# Patient Record
Sex: Male | Born: 1950 | Race: White | Hispanic: No | Marital: Married | Smoking: Current every day smoker
Health system: Southern US, Community
[De-identification: ages and names within clinical notes are randomized; demographics above are authoritative.]

## PROBLEM LIST (undated history)

## (undated) DIAGNOSIS — E119 Type 2 diabetes mellitus without complications: Secondary | ICD-10-CM

## (undated) DIAGNOSIS — J9610 Chronic respiratory failure, unspecified whether with hypoxia or hypercapnia: Secondary | ICD-10-CM

## (undated) DIAGNOSIS — J449 Chronic obstructive pulmonary disease, unspecified: Secondary | ICD-10-CM

## (undated) DIAGNOSIS — I5022 Chronic systolic (congestive) heart failure: Secondary | ICD-10-CM

## (undated) DIAGNOSIS — I10 Essential (primary) hypertension: Secondary | ICD-10-CM

## (undated) DIAGNOSIS — J189 Pneumonia, unspecified organism: Secondary | ICD-10-CM

## (undated) DIAGNOSIS — G4733 Obstructive sleep apnea (adult) (pediatric): Secondary | ICD-10-CM

## (undated) DIAGNOSIS — I2510 Atherosclerotic heart disease of native coronary artery without angina pectoris: Secondary | ICD-10-CM

## (undated) DIAGNOSIS — Z720 Tobacco use: Secondary | ICD-10-CM

## (undated) HISTORY — DX: Chronic respiratory failure, unspecified whether with hypoxia or hypercapnia: J96.10

## (undated) HISTORY — DX: Chronic obstructive pulmonary disease, unspecified: J44.9

## (undated) HISTORY — DX: Essential (primary) hypertension: I10

## (undated) HISTORY — PX: CATARACT EXTRACTION: SUR2

## (undated) HISTORY — DX: Tobacco use: Z72.0

## (undated) HISTORY — DX: Atherosclerotic heart disease of native coronary artery without angina pectoris: I25.10

## (undated) HISTORY — DX: Morbid (severe) obesity due to excess calories: E66.01

## (undated) HISTORY — PX: HERNIA REPAIR: SHX51

## (undated) HISTORY — DX: Type 2 diabetes mellitus without complications: E11.9

## (undated) HISTORY — DX: Obstructive sleep apnea (adult) (pediatric): G47.33

## (undated) HISTORY — DX: Chronic systolic (congestive) heart failure: I50.22

## (undated) HISTORY — DX: Pneumonia, unspecified organism: J18.9

---

## 2005-05-03 ENCOUNTER — Other Ambulatory Visit: Payer: Self-pay

## 2005-05-03 ENCOUNTER — Inpatient Hospital Stay: Payer: Self-pay | Admitting: Internal Medicine

## 2006-10-27 ENCOUNTER — Other Ambulatory Visit: Payer: Self-pay

## 2006-10-27 ENCOUNTER — Inpatient Hospital Stay: Payer: Self-pay | Admitting: Internal Medicine

## 2008-01-18 ENCOUNTER — Emergency Department: Payer: Self-pay | Admitting: Emergency Medicine

## 2008-10-25 ENCOUNTER — Inpatient Hospital Stay: Payer: Self-pay | Admitting: Internal Medicine

## 2010-11-14 ENCOUNTER — Ambulatory Visit: Payer: Self-pay | Admitting: Unknown Physician Specialty

## 2010-11-21 ENCOUNTER — Ambulatory Visit: Payer: Self-pay | Admitting: Unknown Physician Specialty

## 2011-08-29 ENCOUNTER — Other Ambulatory Visit: Payer: Self-pay | Admitting: Specialist

## 2011-09-02 LAB — BODY FLUID CULTURE

## 2012-05-11 ENCOUNTER — Inpatient Hospital Stay: Payer: Self-pay | Admitting: Internal Medicine

## 2012-05-11 LAB — CBC
HGB: 16.2 g/dL (ref 13.0–18.0)
MCH: 30.2 pg (ref 26.0–34.0)
MCHC: 31.8 g/dL — ABNORMAL LOW (ref 32.0–36.0)
MCV: 95 fL (ref 80–100)
Platelet: 246 10*3/uL (ref 150–440)
RBC: 5.38 10*6/uL (ref 4.40–5.90)
RDW: 16.1 % — ABNORMAL HIGH (ref 11.5–14.5)

## 2012-05-11 LAB — COMPREHENSIVE METABOLIC PANEL
Albumin: 3.1 g/dL — ABNORMAL LOW (ref 3.4–5.0)
Bilirubin,Total: 1.6 mg/dL — ABNORMAL HIGH (ref 0.2–1.0)
Calcium, Total: 9.7 mg/dL (ref 8.5–10.1)
Chloride: 92 mmol/L — ABNORMAL LOW (ref 98–107)
Co2: 34 mmol/L — ABNORMAL HIGH (ref 21–32)
Creatinine: 0.78 mg/dL (ref 0.60–1.30)
EGFR (Non-African Amer.): >60
Glucose: 123 mg/dL — ABNORMAL HIGH (ref 65–99)
Potassium: 4.4 mmol/L (ref 3.5–5.1)
Sodium: 131 mmol/L — ABNORMAL LOW (ref 136–145)
Total Protein: 7.7 g/dL (ref 6.4–8.2)

## 2012-05-11 LAB — PRO B NATRIURETIC PEPTIDE: B-Type Natriuretic Peptide: 1682 pg/mL — ABNORMAL HIGH (ref 0–125)

## 2012-05-12 LAB — BASIC METABOLIC PANEL
BUN: 14 mg/dL (ref 7–18)
Calcium, Total: 9.3 mg/dL (ref 8.5–10.1)
Chloride: 91 mmol/L — ABNORMAL LOW (ref 98–107)
Co2: 34 mmol/L — ABNORMAL HIGH (ref 21–32)
EGFR (African American): >60
EGFR (Non-African Amer.): >60
Osmolality: 266 (ref 275–301)
Sodium: 129 mmol/L — ABNORMAL LOW (ref 136–145)

## 2012-05-12 LAB — CK TOTAL AND CKMB (NOT AT ARMC)
CK, Total: 135 U/L (ref 35–232)
CK, Total: 136 U/L (ref 35–232)
CK-MB: 3 ng/mL (ref 0.5–3.6)

## 2012-05-12 LAB — LIPID PANEL
Cholesterol: 92 mg/dL (ref 0–200)
Ldl Cholesterol, Calc: 65 mg/dL (ref 0–100)
Triglycerides: 57 mg/dL (ref 0–200)
VLDL Cholesterol, Calc: 11 mg/dL (ref 5–40)

## 2012-05-12 LAB — CBC WITH DIFFERENTIAL/PLATELET
Eosinophil #: 0 10*3/uL (ref 0.0–0.7)
HGB: 15.6 g/dL (ref 13.0–18.0)
Lymphocyte #: 0.4 10*3/uL — ABNORMAL LOW (ref 1.0–3.6)
Lymphocyte %: 4.2 %
MCH: 29.8 pg (ref 26.0–34.0)
MCHC: 31.5 g/dL — ABNORMAL LOW (ref 32.0–36.0)
Monocyte %: 2 %
Neutrophil %: 93.3 %
Platelet: 206 10*3/uL (ref 150–440)
RDW: 16.2 % — ABNORMAL HIGH (ref 11.5–14.5)
WBC: 9.6 10*3/uL (ref 3.8–10.6)

## 2012-05-12 LAB — HEMOGLOBIN A1C: Hemoglobin A1C: 7.5 % — ABNORMAL HIGH (ref 4.2–6.3)

## 2012-05-12 LAB — TROPONIN I: Troponin-I: <0.02 ng/mL

## 2012-05-12 LAB — MAGNESIUM: Magnesium: 1.3 mg/dL — ABNORMAL LOW

## 2012-05-13 LAB — BASIC METABOLIC PANEL
Anion Gap: 2 — ABNORMAL LOW (ref 7–16)
BUN: 19 mg/dL — ABNORMAL HIGH (ref 7–18)
Chloride: 91 mmol/L — ABNORMAL LOW (ref 98–107)
Co2: 40 mmol/L (ref 21–32)
Creatinine: 0.7 mg/dL (ref 0.60–1.30)
EGFR (African American): >60
Glucose: 109 mg/dL — ABNORMAL HIGH (ref 65–99)
Osmolality: 269 (ref 275–301)
Potassium: 4 mmol/L (ref 3.5–5.1)
Sodium: 133 mmol/L — ABNORMAL LOW (ref 136–145)

## 2012-05-13 LAB — CBC WITH DIFFERENTIAL/PLATELET
Eosinophil #: 0 10*3/uL (ref 0.0–0.7)
Eosinophil %: 0.3 %
HGB: 14.9 g/dL (ref 13.0–18.0)
Lymphocyte #: 1.6 10*3/uL (ref 1.0–3.6)
Lymphocyte %: 19.7 %
MCH: 29.8 pg (ref 26.0–34.0)
MCHC: 31.3 g/dL — ABNORMAL LOW (ref 32.0–36.0)
MCV: 95 fL (ref 80–100)
Monocyte %: 8.5 %
Neutrophil %: 70.7 %
Platelet: 209 10*3/uL (ref 150–440)
RBC: 5 10*6/uL (ref 4.40–5.90)
WBC: 8.2 10*3/uL (ref 3.8–10.6)

## 2012-05-17 LAB — CULTURE, BLOOD (SINGLE)

## 2012-05-20 ENCOUNTER — Encounter: Payer: Self-pay | Admitting: Cardiovascular Disease

## 2012-05-20 ENCOUNTER — Ambulatory Visit (INDEPENDENT_AMBULATORY_CARE_PROVIDER_SITE_OTHER): Payer: Medicare Other | Admitting: Cardiovascular Disease

## 2012-05-20 VITALS — BP 156/102 | HR 87 | Ht 72.0 in | Wt 314.8 lb

## 2012-05-20 DIAGNOSIS — I10 Essential (primary) hypertension: Secondary | ICD-10-CM | POA: Insufficient documentation

## 2012-05-20 DIAGNOSIS — I5022 Chronic systolic (congestive) heart failure: Secondary | ICD-10-CM | POA: Insufficient documentation

## 2012-05-20 DIAGNOSIS — I509 Heart failure, unspecified: Secondary | ICD-10-CM

## 2012-05-20 MED ORDER — ASPIRIN 81 MG PO TABS: 81.0000 mg | ORAL_TABLET | Freq: Every day | ORAL | Status: AC

## 2012-05-20 MED ORDER — METOPROLOL SUCCINATE ER 25 MG PO TB24: 25.0000 mg | ORAL_TABLET | Freq: Every day | ORAL | Status: DC

## 2012-05-20 MED ORDER — LISINOPRIL 10 MG PO TABS: 10.0000 mg | ORAL_TABLET | Freq: Every day | ORAL | Status: DC

## 2012-05-20 NOTE — Progress Notes (Signed)
Primary care physician: Dr. Maryellen Pile  HPI  This is a 62 year old male who is here today to establish cardiovascular care due to newly diagnosed cardiomyopathy. He has extensive medical problems that include COPD with extensive smoking history, hypertension, type 2 diabetes, morbid obesity and obstructive sleep apnea. He presented on April 12 to [facility] with worsening dyspnea and hypoxia. He was diagnosed with pneumonia. He was significantly tachycardic on presentation and was also noted to have fluid overload. He had an echocardiogram done which showed an ejection fraction of 25%. The patient is not aware of any previous cardiac history before this. He reports no known history of coronary artery disease. He denies any chest pain. He continues to have significant dyspnea but feels significantly better. He is taking Lasix 40 mg once daily. He reports chronic lower extremity edema. He is on home oxygen and quit smoking only 2 weeks ago.  Allergies  Allergen Reactions  . Androgel (Testosterone)     swelling     Current Outpatient Prescriptions on File Prior to Visit  Medication Sig Dispense Refill  . albuterol (PROVENTIL HFA;VENTOLIN HFA) 108 (90 BASE) MCG/ACT inhaler Inhale 2 puffs into the lungs every 4 (four) hours as needed for wheezing.      . furosemide (LASIX) 40 MG tablet Take 40 mg by mouth daily.       Marland Kitchen glimepiride (AMARYL) 4 MG tablet Take 4 mg by mouth daily before breakfast.      . pramipexole (MIRAPEX) 0.5 MG tablet Take 0.5 mg by mouth daily.      Marland Kitchen tiotropium (SPIRIVA) 18 MCG inhalation capsule Place 18 mcg into inhaler and inhale daily.       No current facility-administered medications on file prior to visit.     Past Medical History  Diagnosis Date  . Diabetes mellitus without complication   . CHF (congestive heart failure)   . COPD (chronic obstructive pulmonary disease)   . OSA (obstructive sleep apnea)   . Morbid obesity   . Chronic respiratory failure   .  Cardiomyopathy     EF 25 %  . Hypertension   . Chronic systolic heart failure     EF of 20%     Past Surgical History  Procedure Laterality Date  . Cataract extraction      right eye  . Hernia repair       Family History  Problem Relation Age of Onset  . Hypertension Father   . Heart disease Father   . Hyperlipidemia Father      History   Social History  . Marital Status: Married    Spouse Name: N/A    Number of Children: N/A  . Years of Education: N/A   Occupational History  . Not on file.   Social History Main Topics  . Smoking status: Former Smoker -- 1.50 packs/day for 40 years    Types: Cigarettes  . Smokeless tobacco: Former Neurosurgeon    Quit date: 05/12/2012  . Alcohol Use: No  . Drug Use: No  . Sexually Active: Not on file   Other Topics Concern  . Not on file   Social History Narrative  . No narrative on file     ROS Constitutional: Negative for fever, chills, diaphoresis, activity change, appetite change and fatigue.  HENT: Negative for hearing loss, nosebleeds, congestion, sore throat, facial swelling, drooling, trouble swallowing, neck pain, voice change, sinus pressure and tinnitus.  Eyes: Negative for photophobia, pain, discharge and visual disturbance.  Respiratory:  Negative forchest tightness, and wheezing.  Cardiovascular: Negative for chest pain, palpitations . Gastrointestinal: Negative for nausea, vomiting, abdominal pain, diarrhea, constipation, blood in stool and abdominal distention.  Genitourinary: Negative for dysuria, urgency, frequency, hematuria and decreased urine volume.  Musculoskeletal: Negative for myalgias, back pain, joint swelling, arthralgias and gait problem.  Skin: Negative for color change, pallor, rash and wound.  Neurological: Negative for dizziness, tremors, seizures, syncope, speech difficulty, weakness, light-headedness, numbness and headaches.  Psychiatric/Behavioral: Negative for suicidal ideas, hallucinations,  behavioral problems and agitation. The patient is not nervous/anxious.     PHYSICAL EXAM   BP 156/102  Pulse 87  Ht 6' (1.829 m)  Wt 314 lb 12 oz (142.77 kg)  BMI 42.68 kg/m2 Constitutional: He is oriented to person, place, and time. He appears well-developed and well-nourished. No distress.  HENT: No nasal discharge.  Head: Normocephalic and atraumatic.  Eyes: Pupils are equal and round. Right eye exhibits no discharge. Left eye exhibits no discharge.  Neck: Normal range of motion. Neck supple. No JVD present. No thyromegaly present.  Cardiovascular: Normal rate, regular rhythm, normal heart sounds and. Exam reveals no gallop and no friction rub. No murmur heard.  Pulmonary/Chest: Effort normal and breath sounds normal. No stridor. No respiratory distress. He has no wheezes. He has no rales. He exhibits no tenderness.  Abdominal: Soft. Bowel sounds are normal. He exhibits no distension. There is no tenderness. There is no rebound and no guarding.  Musculoskeletal: Normal range of motion. He exhibits trace edema and no tenderness.  Neurological: He is alert and oriented to person, place, and time. Coordination normal.  Skin: Skin is warm and dry. Chronic stasis dermatitis in the lower extremities. He is not diaphoretic. No erythema. No pallor.  Psychiatric: He has a normal mood and affect. His behavior is normal. Judgment and thought content normal.       EKG: Normal sinus with incomplete right bundle branch block and possible right ventricular hypertrophy.   ASSESSMENT AND PLAN

## 2012-05-20 NOTE — Patient Instructions (Addendum)
Start Aspirin 81 mg once daily.  Increase Metoprolol to 25 mg once daily.  Start Lisinopril 10 mg once daily.  Labs to be done in 1 week.  Follow up in 2 weeks.

## 2012-05-20 NOTE — Assessment & Plan Note (Signed)
His blood pressure is elevated. Changes in his medications are outlined above.

## 2012-05-20 NOTE — Assessment & Plan Note (Signed)
Robert Mccarthy was diagnosed recently with cardiomyopathy with an ejection fraction of 25%. The differential diagnosis is still wide at this time include ischemic cardiomyopathy, diabetic cardiomyopathy or nonischemic cardiomyopathy. I think we need to optimize his medications. He appears to be euvolemic and I recommend continuing the current dose of Lasix. I will go ahead and increase the dose of Toprol to 25 mg once daily and add lisinopril 10 mg once daily. Check basic metabolic profile in one week. Followup in 2 weeks for up titration of medications. He has a high chance of underlying coronary artery disease given his multiple risk factors. I will plan on proceeding with ischemic cardiac evaluation once his heart failure is more optimize. I also asked him to start taking aspirin 81 mg once daily. We discussed the importance of low sodium diet.

## 2012-05-22 ENCOUNTER — Encounter: Payer: Self-pay | Admitting: Cardiovascular Disease

## 2012-05-29 ENCOUNTER — Ambulatory Visit (INDEPENDENT_AMBULATORY_CARE_PROVIDER_SITE_OTHER): Payer: Medicare Other

## 2012-05-29 DIAGNOSIS — I509 Heart failure, unspecified: Secondary | ICD-10-CM

## 2012-05-30 LAB — BASIC METABOLIC PANEL
BUN/Creatinine Ratio: 27 — ABNORMAL HIGH (ref 10–22)
BUN: 18 mg/dL (ref 8–27)
CO2: 28 mmol/L (ref 19–28)
Calcium: 10.3 mg/dL — ABNORMAL HIGH (ref 8.6–10.2)
Chloride: 92 mmol/L — ABNORMAL LOW (ref 97–108)
Creatinine, Ser: 0.67 mg/dL — ABNORMAL LOW (ref 0.76–1.27)
Glucose: 59 mg/dL — ABNORMAL LOW (ref 65–99)

## 2012-05-31 ENCOUNTER — Other Ambulatory Visit: Payer: Self-pay

## 2012-06-13 ENCOUNTER — Ambulatory Visit (INDEPENDENT_AMBULATORY_CARE_PROVIDER_SITE_OTHER): Payer: Medicare Other | Admitting: Cardiovascular Disease

## 2012-06-13 ENCOUNTER — Encounter: Payer: Self-pay | Admitting: Cardiovascular Disease

## 2012-06-13 VITALS — BP 158/72 | HR 73 | Ht 72.0 in | Wt 318.2 lb

## 2012-06-13 DIAGNOSIS — I10 Essential (primary) hypertension: Secondary | ICD-10-CM

## 2012-06-13 DIAGNOSIS — I5022 Chronic systolic (congestive) heart failure: Secondary | ICD-10-CM

## 2012-06-13 DIAGNOSIS — I509 Heart failure, unspecified: Secondary | ICD-10-CM

## 2012-06-13 MED ORDER — LISINOPRIL 20 MG PO TABS: 20.0000 mg | ORAL_TABLET | Freq: Every day | ORAL | Status: DC

## 2012-06-13 MED ORDER — METOPROLOL SUCCINATE ER 50 MG PO TB24: 50.0000 mg | ORAL_TABLET | Freq: Every day | ORAL | Status: DC

## 2012-06-13 NOTE — Patient Instructions (Addendum)
Increase Lisinopril to 20 mg once daily.  Increase Metoprolol ER to 50 mg once daily.   Schedule a left cardiac cath.

## 2012-06-13 NOTE — Progress Notes (Signed)
Primary care physician: Dr. Maryellen Pile  HPI  This is a 62 year old male who is here today for a followup visit regarding chronic systolic heart failure and recently diagnosed  cardiomyopathy. He has extensive medical problems that include COPD with extensive smoking history, hypertension, type 2 diabetes, morbid obesity and obstructive sleep apnea. He presented on April 12 to [facility] with worsening dyspnea and hypoxia. He was diagnosed with pneumonia. He was significantly tachycardic on presentation and was also noted to have fluid overload. He had an echocardiogram done which showed an ejection fraction of 25%. He quit smoking at that time. During last visit, I added small dose Toprol and lisinopril for cardiomyopathy. He continues to take Lasix 40 mg once daily. Overall, he feels slightly better but continues to have significant dyspnea without chest pain. He denies palpitations and is noted today to be in ventricular bigeminy.  Allergies  Allergen Reactions  . Androgel (Testosterone)     swelling     Current Outpatient Prescriptions on File Prior to Visit  Medication Sig Dispense Refill  . albuterol (PROVENTIL HFA;VENTOLIN HFA) 108 (90 BASE) MCG/ACT inhaler Inhale 2 puffs into the lungs every 4 (four) hours as needed for wheezing.      Marland Kitchen aspirin 81 MG tablet Take 1 tablet (81 mg total) by mouth daily.  30 tablet  0  . furosemide (LASIX) 40 MG tablet Take 40 mg by mouth daily.       Marland Kitchen glimepiride (AMARYL) 4 MG tablet Take 4 mg by mouth daily before breakfast.      . ipratropium-albuterol (DUONEB) 0.5-2.5 (3) MG/3ML SOLN Take 3 mLs by nebulization every 4 (four) hours as needed.      Marland Kitchen lisinopril (PRINIVIL,ZESTRIL) 10 MG tablet Take 1 tablet (10 mg total) by mouth daily.  30 tablet  3  . metoprolol succinate (TOPROL XL) 25 MG 24 hr tablet Take 1 tablet (25 mg total) by mouth daily.  30 tablet  3  . NON FORMULARY Oxygen 2 liters daily      . pramipexole (MIRAPEX) 0.5 MG tablet Take 0.5 mg by mouth  daily.      Marland Kitchen tiotropium (SPIRIVA) 18 MCG inhalation capsule Place 18 mcg into inhaler and inhale daily.       No current facility-administered medications on file prior to visit.     Past Medical History  Diagnosis Date  . Diabetes mellitus without complication   . CHF (congestive heart failure)   . COPD (chronic obstructive pulmonary disease)   . OSA (obstructive sleep apnea)   . Morbid obesity   . Chronic respiratory failure   . Cardiomyopathy     EF 25 %  . Hypertension   . Chronic systolic heart failure     EF of 20%     Past Surgical History  Procedure Laterality Date  . Cataract extraction      right eye  . Hernia repair       Family History  Problem Relation Age of Onset  . Hypertension Father   . Heart disease Father   . Hyperlipidemia Father      History   Social History  . Marital Status: Married    Spouse Name: N/A    Number of Children: N/A  . Years of Education: N/A   Occupational History  . Not on file.   Social History Main Topics  . Smoking status: Former Smoker -- 1.50 packs/day for 40 years    Types: Cigarettes  . Smokeless tobacco: Former  User    Quit date: 05/12/2012  . Alcohol Use: No  . Drug Use: No  . Sexually Active: Not on file   Other Topics Concern  . Not on file   Social History Narrative  . No narrative on file        PHYSICAL EXAM   BP 158/72  Pulse 73  Ht 6' (1.829 m)  Wt 318 lb 4 oz (144.357 kg)  BMI 43.15 kg/m2  SpO2 85% Constitutional: He is oriented to person, place, and time. He appears well-developed and well-nourished. No distress.  HENT: No nasal discharge.  Head: Normocephalic and atraumatic.  Eyes: Pupils are equal and round. Right eye exhibits no discharge. Left eye exhibits no discharge.  Neck: Normal range of motion. Neck supple. No JVD present. No thyromegaly present.  Cardiovascular: Normal rate, regular rhythm, normal heart sounds and. Exam reveals no gallop and no friction rub. No  murmur heard.  Pulmonary/Chest: Effort normal and breath sounds normal. No stridor. No respiratory distress. He has no wheezes. He has no rales. He exhibits no tenderness.  Abdominal: Soft. Bowel sounds are normal. He exhibits no distension. There is no tenderness. There is no rebound and no guarding.  Musculoskeletal: Normal range of motion. He exhibits trace edema and no tenderness.  Neurological: He is alert and oriented to person, place, and time. Coordination normal.  Skin: Skin is warm and dry. Chronic stasis dermatitis in the lower extremities. He is not diaphoretic. No erythema. No pallor.  Psychiatric: He has a normal mood and affect. His behavior is normal. Judgment and thought content normal.       EKG: Normal sinus rhythm with first degree AV block, frequent PVCs in the form of ventricular bigeminy, incomplete right bundle branch block.   ASSESSMENT AND PLAN

## 2012-06-13 NOTE — Assessment & Plan Note (Signed)
He continues to be in [place] Heart Association class III. Ejection fraction was severely reduced at 25%. I think we have to rule out underlying ischemic etiology especially with his multiple risk factors including diabetes. Due to that, I recommend proceeding with a left heart catheterization. A stress test would have a low diagnostic yield in his situation due to cardiomyopathy and morbid obesity. Risks, benefits were discussed with him. For now, I will go ahead and increase the dose of Toprol to 50 mg once daily especially that he is having frequent PVCs. I will also increase lisinopril to 20 mg once daily.

## 2012-06-13 NOTE — Assessment & Plan Note (Signed)
Blood pressure improved but still not controlled. His antihypertensive medications were increased as outlined above.

## 2012-06-14 ENCOUNTER — Encounter: Payer: Self-pay | Admitting: Cardiovascular Disease

## 2012-06-14 LAB — CBC WITH DIFFERENTIAL/PLATELET
Basos: 1 % (ref 0–3)
Eosinophils Absolute: 0.2 10*3/uL (ref 0.0–0.4)
Hemoglobin: 16.4 g/dL (ref 12.6–17.7)
Immature Grans (Abs): 0 10*3/uL (ref 0.0–0.1)
Lymphs: 29 % (ref 14–46)
MCH: 30 pg (ref 26.6–33.0)
Monocytes: 7 % (ref 4–12)
Neutrophils Absolute: 4.5 10*3/uL (ref 1.4–7.0)
Neutrophils Relative %: 61 % (ref 40–74)
RBC: 5.46 x10E6/uL (ref 4.14–5.80)
WBC: 7.3 10*3/uL (ref 3.4–10.8)

## 2012-06-14 LAB — PROTIME-INR
INR: 1.1 (ref 0.8–1.2)
Prothrombin Time: 11.8 s (ref 9.1–12.0)

## 2012-06-14 LAB — BASIC METABOLIC PANEL
BUN/Creatinine Ratio: 19 (ref 10–22)
BUN: 11 mg/dL (ref 8–27)
CO2: 28 mmol/L (ref 19–28)
Calcium: 10.1 mg/dL (ref 8.6–10.2)
Chloride: 96 mmol/L — ABNORMAL LOW (ref 97–108)
Creatinine, Ser: 0.57 mg/dL — ABNORMAL LOW (ref 0.76–1.27)
Glucose: 112 mg/dL — ABNORMAL HIGH (ref 65–99)

## 2012-06-20 ENCOUNTER — Ambulatory Visit: Payer: Self-pay | Admitting: Cardiovascular Disease

## 2012-06-20 DIAGNOSIS — I2510 Atherosclerotic heart disease of native coronary artery without angina pectoris: Secondary | ICD-10-CM

## 2012-06-20 HISTORY — PX: CARDIAC CATHETERIZATION: SHX172

## 2012-06-27 ENCOUNTER — Encounter: Payer: Self-pay | Admitting: *Deleted

## 2012-07-09 ENCOUNTER — Encounter: Payer: Self-pay | Admitting: Cardiovascular Disease

## 2012-07-09 ENCOUNTER — Ambulatory Visit (INDEPENDENT_AMBULATORY_CARE_PROVIDER_SITE_OTHER): Payer: Medicare Other | Admitting: Cardiovascular Disease

## 2012-07-09 VITALS — BP 132/92 | HR 73 | Ht 72.0 in | Wt 327.5 lb

## 2012-07-09 DIAGNOSIS — I10 Essential (primary) hypertension: Secondary | ICD-10-CM

## 2012-07-09 DIAGNOSIS — I5022 Chronic systolic (congestive) heart failure: Secondary | ICD-10-CM

## 2012-07-09 DIAGNOSIS — I2510 Atherosclerotic heart disease of native coronary artery without angina pectoris: Secondary | ICD-10-CM

## 2012-07-09 MED ORDER — LISINOPRIL 40 MG PO TABS: 40.0000 mg | ORAL_TABLET | Freq: Every day | ORAL | Status: DC

## 2012-07-09 NOTE — Assessment & Plan Note (Signed)
His blood pressure is still not at target. Lisinopril was increased as outlined above.

## 2012-07-09 NOTE — Patient Instructions (Addendum)
Increase Lisinopril to 40 mg once daily (you can take 2 tablets of 20 mg).  Continue other medications.  Follow up in 1 months.

## 2012-07-09 NOTE — Assessment & Plan Note (Signed)
Recent cardiac catheterization showed moderate 2 vessel coronary artery disease. Continue aspirin daily. Due to this and the fact that he is diabetic, he was likely benefit from being on a statin. I will discuss this with him during next visit and likely atorvastatin.

## 2012-07-09 NOTE — Assessment & Plan Note (Signed)
He continues to be in [place] Heart Association class III. Ejection fraction was initially  25% but improved recently to 35%. Cardiac catheterization showed no evidence of obstructive CAD. 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  I recommend optimizing his medications. I will go ahead and increase lisinopril to 40 mg once daily. His renal function is known to be normal. The next step would be to increase the dose of Toprol and possibly adding aldosterone blocking agent. He is likely mildly fluid overloaded based on elevated LVEDP during catheterization. However, his blood pressure was also high at that time. Continue current dose of Lasix 40 mg once daily.

## 2012-07-09 NOTE — Progress Notes (Signed)
Primary care physician: Dr. Maryellen Pile  HPI  This is a 62 year old male who is here today for a followup visit regarding chronic systolic heart failure. He has extensive medical problems that include severe COPD with extensive smoking history currently on home oxygen, hypertension, type 2 diabetes, morbid obesity and obstructive sleep apnea. He presented on April 12 to [facility] with worsening dyspnea and hypoxia. He was diagnosed with pneumonia. He was significantly tachycardic on presentation and was also noted to have fluid overload. He had an echocardiogram done which showed an ejection fraction of 25%. He quit smoking at that time. I started him on small dose Toprol and lisinopril for cardiomyopathy. He underwent cardiac catheterization via the right radial artery which showed moderate two-vessel disease in the mid RCA and mid left circumflex. Ejection fraction was 35%. His cardiomyopathy was out of proportion to coronary artery disease. His blood pressure was elevated during the catheterization with an LVEDP of 22 mm mercury. Overall, he continues to improve. He denies chest discomfort.  Allergies  Allergen Reactions  . Androgel (Testosterone)     swelling     Current Outpatient Prescriptions on File Prior to Visit  Medication Sig Dispense Refill  . albuterol (PROVENTIL HFA;VENTOLIN HFA) 108 (90 BASE) MCG/ACT inhaler Inhale 2 puffs into the lungs every 4 (four) hours as needed for wheezing.      Marland Kitchen aspirin 81 MG tablet Take 1 tablet (81 mg total) by mouth daily.  30 tablet  0  . furosemide (LASIX) 40 MG tablet Take 40 mg by mouth daily.       Marland Kitchen glimepiride (AMARYL) 4 MG tablet Take 4 mg by mouth daily before breakfast.      . ipratropium-albuterol (DUONEB) 0.5-2.5 (3) MG/3ML SOLN Take 3 mLs by nebulization every 4 (four) hours as needed.      Marland Kitchen lisinopril (PRINIVIL,ZESTRIL) 20 MG tablet Take 1 tablet (20 mg total) by mouth daily.  30 tablet  3  . metoprolol succinate (TOPROL-XL) 50 MG 24 hr  tablet Take 1 tablet (50 mg total) by mouth daily. Take with or immediately following a meal.  30 tablet  3  . NON FORMULARY Oxygen 2 liters daily      . tiotropium (SPIRIVA) 18 MCG inhalation capsule Place 18 mcg into inhaler and inhale daily.       No current facility-administered medications on file prior to visit.     Past Medical History  Diagnosis Date  . Diabetes mellitus without complication   . CHF (congestive heart failure)   . COPD (chronic obstructive pulmonary disease)   . OSA (obstructive sleep apnea)   . Morbid obesity   . Chronic respiratory failure   . Cardiomyopathy     EF 25 %  . Hypertension   . Chronic systolic heart failure     EF of 20%     Past Surgical History  Procedure Laterality Date  . Cataract extraction      right eye  . Hernia repair    . Cardiac catheterization  06/20/12    [facility];EF 35%     Family History  Problem Relation Age of Onset  . Hypertension Father   . Heart disease Father   . Hyperlipidemia Father      History   Social History  . Marital Status: Married    Spouse Name: N/A    Number of Children: N/A  . Years of Education: N/A   Occupational History  . Not on file.   Social History  Main Topics  . Smoking status: Former Smoker -- 1.50 packs/day for 40 years    Types: Cigarettes  . Smokeless tobacco: Former Neurosurgeon    Quit date: 05/12/2012  . Alcohol Use: No  . Drug Use: No  . Sexually Active: Not on file   Other Topics Concern  . Not on file   Social History Narrative  . No narrative on file        PHYSICAL EXAM   BP 132/92  Pulse 73  Ht 6' (1.829 m)  Wt 327 lb 8 oz (148.553 kg)  BMI 44.41 kg/m2 Constitutional: He is oriented to person, place, and time. He appears well-developed and well-nourished. No distress.  HENT: No nasal discharge.  Head: Normocephalic and atraumatic.  Eyes: Pupils are equal and round. Right eye exhibits no discharge. Left eye exhibits no discharge.  Neck: Normal range of  motion. Neck supple. No JVD present. No thyromegaly present.  Cardiovascular: Normal rate, regular rhythm, normal heart sounds and. Exam reveals no gallop and no friction rub. No murmur heard.  Pulmonary/Chest: Effort normal and breath sounds normal. No stridor. No respiratory distress. He has no wheezes. He has no rales. He exhibits no tenderness.  Abdominal: Soft. Bowel sounds are normal. He exhibits no distension. There is no tenderness. There is no rebound and no guarding.  Musculoskeletal: Normal range of motion. He exhibits trace edema and no tenderness.  Neurological: He is alert and oriented to person, place, and time. Coordination normal.  Skin: Skin is warm and dry. Chronic stasis dermatitis in the lower extremities. He is not diaphoretic. No erythema. No pallor.  Psychiatric: He has a normal mood and affect. His behavior is normal. Judgment and thought content normal.  Right radial pulse is normal with no hematoma    EKG: Sinus  Rhythm  - occasional ectopic ventricular beat    Low voltage in limb leads.   -Nonspecific QRS widening and right axis -consider right ventricular hypertrophy.   -  Negative precordial T-waves.   ABNORMAL    ASSESSMENT AND PLAN

## 2012-08-09 ENCOUNTER — Ambulatory Visit (INDEPENDENT_AMBULATORY_CARE_PROVIDER_SITE_OTHER): Payer: Medicare Other | Admitting: Cardiovascular Disease

## 2012-08-09 ENCOUNTER — Encounter: Payer: Self-pay | Admitting: Cardiovascular Disease

## 2012-08-09 VITALS — BP 134/74 | HR 68 | Ht 72.0 in | Wt 307.8 lb

## 2012-08-09 DIAGNOSIS — I509 Heart failure, unspecified: Secondary | ICD-10-CM

## 2012-08-09 DIAGNOSIS — I5022 Chronic systolic (congestive) heart failure: Secondary | ICD-10-CM

## 2012-08-09 DIAGNOSIS — I2510 Atherosclerotic heart disease of native coronary artery without angina pectoris: Secondary | ICD-10-CM

## 2012-08-09 DIAGNOSIS — I10 Essential (primary) hypertension: Secondary | ICD-10-CM

## 2012-08-09 MED ORDER — EPLERENONE 25 MG PO TABS: 25.0000 mg | ORAL_TABLET | Freq: Every day | ORAL | Status: DC

## 2012-08-09 NOTE — Assessment & Plan Note (Signed)
Ejection fraction was initially  25% but improved recently to 35%. Cardiac catheterization showed no evidence of obstructive CAD. 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 It's difficult to determine his functional status due to advanced COPD. Continue treatment with Toprol and lisinopril. I will add Eplerenone 25 mg once daily. Stop potassium supplements. Check basic metabolic profile in one week.

## 2012-08-09 NOTE — Progress Notes (Signed)
Primary care physician: Dr. Maryellen Pile  HPI  This is a 62 year old male who is here today for a followup visit regarding chronic systolic heart failure. He has extensive medical problems that include severe COPD with extensive smoking history currently on home oxygen, hypertension, type 2 diabetes, morbid obesity and obstructive sleep apnea. He presented on April 12 to [facility] with worsening dyspnea and hypoxia. He was diagnosed with pneumonia. He was significantly tachycardic on presentation and was also noted to have fluid overload. He had an echocardiogram done which showed an ejection fraction of 25%. He quit smoking at that time. I started him on small dose Toprol and lisinopril for cardiomyopathy. He underwent cardiac catheterization via the right radial artery which showed moderate two-vessel disease in the mid RCA and mid left circumflex. Ejection fraction was 35%. His cardiomyopathy was out of proportion to coronary artery disease. His blood pressure was elevated during the catheterization with an LVEDP of 22 mm mercury. Overall, he continues to improve. He denies chest discomfort. He uses oxygen continuously. His blood pressure is more controlled after increasing lisinopril to 40 mg once daily.   Allergies  Allergen Reactions  . Androgel (Testosterone)     swelling     Current Outpatient Prescriptions on File Prior to Visit  Medication Sig Dispense Refill  . albuterol (PROVENTIL HFA;VENTOLIN HFA) 108 (90 BASE) MCG/ACT inhaler Inhale 2 puffs into the lungs every 4 (four) hours as needed for wheezing.      Marland Kitchen aspirin 81 MG tablet Take 1 tablet (81 mg total) by mouth daily.  30 tablet  0  . furosemide (LASIX) 40 MG tablet Take 40 mg by mouth daily.       Marland Kitchen glimepiride (AMARYL) 4 MG tablet Take 4 mg by mouth daily before breakfast.      . ipratropium-albuterol (DUONEB) 0.5-2.5 (3) MG/3ML SOLN Take 3 mLs by nebulization every 4 (four) hours as needed.      Marland Kitchen lisinopril (PRINIVIL,ZESTRIL) 40 MG  tablet Take 1 tablet (40 mg total) by mouth daily.  30 tablet  6  . metoprolol succinate (TOPROL-XL) 50 MG 24 hr tablet Take 1 tablet (50 mg total) by mouth daily. Take with or immediately following a meal.  30 tablet  3  . NON FORMULARY Oxygen 2 liters daily      . pramipexole (MIRAPEX) 1 MG tablet Take 1 mg by mouth daily.      Marland Kitchen tiotropium (SPIRIVA) 18 MCG inhalation capsule Place 18 mcg into inhaler and inhale daily.       No current facility-administered medications on file prior to visit.     Past Medical History  Diagnosis Date  . Diabetes mellitus without complication   . CHF (congestive heart failure)   . COPD (chronic obstructive pulmonary disease)   . OSA (obstructive sleep apnea)   . Morbid obesity   . Chronic respiratory failure   . Cardiomyopathy     EF 25 %  . Hypertension   . Chronic systolic heart failure     EF of 20%  . Coronary artery disease     Cardiac cath in 05/2012: EF 35%, no evidence of obstructive disease with 60% mid left circumflex stenosis and 60% mid RCA stenosis     Past Surgical History  Procedure Laterality Date  . Cataract extraction      right eye  . Hernia repair    . Cardiac catheterization  06/20/12    [facility];EF 35%     Family History  Problem Relation Age of Onset  . Hypertension Father   . Heart disease Father   . Hyperlipidemia Father      History   Social History  . Marital Status: Married    Spouse Name: N/A    Number of Children: N/A  . Years of Education: N/A   Occupational History  . Not on file.   Social History Main Topics  . Smoking status: Former Smoker -- 1.50 packs/day for 40 years    Types: Cigarettes  . Smokeless tobacco: Former Neurosurgeon    Quit date: 05/12/2012  . Alcohol Use: No  . Drug Use: No  . Sexually Active: Not on file   Other Topics Concern  . Not on file   Social History Narrative  . No narrative on file        PHYSICAL EXAM   BP 134/74  Pulse 68  Ht 6' (1.829 m)  Wt 307 lb  12.8 oz (139.617 kg)  BMI 41.74 kg/m2 Constitutional: He is oriented to person, place, and time. He appears well-developed and well-nourished. No distress.  HENT: No nasal discharge.  Head: Normocephalic and atraumatic.  Eyes: Pupils are equal and round. Right eye exhibits no discharge. Left eye exhibits no discharge.  Neck: Normal range of motion. Neck supple. No JVD present. No thyromegaly present.  Cardiovascular: Normal rate, regular rhythm, normal heart sounds and. Exam reveals no gallop and no friction rub. No murmur heard.  Pulmonary/Chest: Effort normal and breath sounds normal. No stridor. No respiratory distress. He has no wheezes. He has no rales. He exhibits no tenderness.  Abdominal: Soft. Bowel sounds are normal. He exhibits no distension. There is no tenderness. There is no rebound and no guarding.  Musculoskeletal: Normal range of motion. He exhibits trace edema and no tenderness.  Neurological: He is alert and oriented to person, place, and time. Coordination normal.  Skin: Skin is warm and dry. Chronic stasis dermatitis in the lower extremities. He is not diaphoretic. No erythema. No pallor.  Psychiatric: He has a normal mood and affect. His behavior is normal. Judgment and thought content normal.       ASSESSMENT AND PLAN

## 2012-08-09 NOTE — Assessment & Plan Note (Signed)
He has no symptoms of angina. Continue medical therapy. Cardiac catheterization showed moderate 2 vessel coronary artery disease.

## 2012-08-09 NOTE — Assessment & Plan Note (Signed)
Blood pressure is well controlled after recent changes.

## 2012-08-09 NOTE — Patient Instructions (Addendum)
Stop Potassium.  Start Eplerenone 25 mg once daily.   Come back for labs next week on Wednesday.   Follow up in 3 months.

## 2012-08-14 ENCOUNTER — Other Ambulatory Visit: Payer: Medicare Other

## 2012-08-16 ENCOUNTER — Ambulatory Visit (INDEPENDENT_AMBULATORY_CARE_PROVIDER_SITE_OTHER): Payer: Medicare Other

## 2012-08-16 DIAGNOSIS — I509 Heart failure, unspecified: Secondary | ICD-10-CM

## 2012-08-16 DIAGNOSIS — I5022 Chronic systolic (congestive) heart failure: Secondary | ICD-10-CM

## 2012-08-17 LAB — BASIC METABOLIC PANEL
BUN/Creatinine Ratio: 20 (ref 10–22)
BUN: 14 mg/dL (ref 8–27)
Chloride: 97 mmol/L (ref 97–108)
GFR calc Af Amer: 118 mL/min/{1.73_m2} (ref >59)
Potassium: 5 mmol/L (ref 3.5–5.2)
Sodium: 138 mmol/L (ref 134–144)

## 2012-08-20 ENCOUNTER — Other Ambulatory Visit: Payer: Self-pay | Admitting: Cardiovascular Disease

## 2012-08-20 DIAGNOSIS — I5022 Chronic systolic (congestive) heart failure: Secondary | ICD-10-CM

## 2012-08-20 DIAGNOSIS — Z79899 Other long term (current) drug therapy: Secondary | ICD-10-CM

## 2012-09-16 ENCOUNTER — Ambulatory Visit (INDEPENDENT_AMBULATORY_CARE_PROVIDER_SITE_OTHER): Payer: Medicare Other

## 2012-09-16 DIAGNOSIS — Z79899 Other long term (current) drug therapy: Secondary | ICD-10-CM

## 2012-09-16 DIAGNOSIS — I5022 Chronic systolic (congestive) heart failure: Secondary | ICD-10-CM

## 2012-09-17 LAB — BASIC METABOLIC PANEL
BUN/Creatinine Ratio: 21 (ref 10–22)
BUN: 14 mg/dL (ref 8–27)
Chloride: 94 mmol/L — ABNORMAL LOW (ref 97–108)
GFR calc Af Amer: 118 mL/min/{1.73_m2} (ref >59)
Sodium: 137 mmol/L (ref 134–144)

## 2012-09-18 ENCOUNTER — Telehealth: Payer: Self-pay

## 2012-09-18 NOTE — Telephone Encounter (Signed)
Message copied by Marilynne Halsted on Wed Sep 18, 2012  9:43 AM ------      Message from: Lorine Bears A      Created: Tue Sep 17, 2012  6:01 PM       Inform patient that labs were normal. Continue same medications. ------

## 2012-09-27 ENCOUNTER — Telehealth: Payer: Self-pay

## 2012-09-27 NOTE — Telephone Encounter (Signed)
Message copied by Marilynne Halsted on Fri Sep 27, 2012 10:02 AM ------      Message from: Lorine Bears A      Created: Tue Sep 17, 2012  6:01 PM       Inform patient that labs were normal. Continue same medications. ------

## 2012-09-27 NOTE — Telephone Encounter (Signed)
Pt informed of results and will call us if he has any questions or concerns.

## 2012-09-27 NOTE — Telephone Encounter (Signed)
Message copied by Marilynne Halsted on Fri Sep 27, 2012 10:17 AM ------      Message from: Lorine Bears A      Created: Tue Sep 17, 2012  6:01 PM       Inform patient that labs were normal. Continue same medications. ------

## 2012-10-08 ENCOUNTER — Telehealth: Payer: Self-pay | Admitting: *Deleted

## 2012-10-08 NOTE — Telephone Encounter (Signed)
Pt would like to know if it's ok to take meloxicam 15 mg.   He is currently taking metoprolol 50 mg daily and wants to make sure they won't interfere with one another.  Please advise.

## 2012-10-08 NOTE — Telephone Encounter (Signed)
Dr. Maryellen Pile put patient on Meloxicam 15 mg for swelling of wrist and patient wants to know if this is ok, please advise

## 2012-10-09 NOTE — Telephone Encounter (Signed)
He can use this only short term for few days but should not stay on it because of his weak heart.

## 2012-10-09 NOTE — Telephone Encounter (Signed)
Spoke w/ pt.  He understands to only take the meloxicam for a few days.

## 2012-10-16 ENCOUNTER — Other Ambulatory Visit: Payer: Self-pay | Admitting: Cardiovascular Disease

## 2012-11-14 ENCOUNTER — Ambulatory Visit (INDEPENDENT_AMBULATORY_CARE_PROVIDER_SITE_OTHER): Payer: Medicare Other | Admitting: Cardiovascular Disease

## 2012-11-14 ENCOUNTER — Encounter: Payer: Self-pay | Admitting: Cardiovascular Disease

## 2012-11-14 VITALS — BP 137/80 | HR 81 | Ht 72.0 in | Wt 244.5 lb

## 2012-11-14 DIAGNOSIS — I10 Essential (primary) hypertension: Secondary | ICD-10-CM

## 2012-11-14 DIAGNOSIS — I2510 Atherosclerotic heart disease of native coronary artery without angina pectoris: Secondary | ICD-10-CM

## 2012-11-14 DIAGNOSIS — I5022 Chronic systolic (congestive) heart failure: Secondary | ICD-10-CM

## 2012-11-14 NOTE — Assessment & Plan Note (Signed)
Ejection fraction was initially  25% but improved to 35%. Cardiac catheterization showed no evidence of obstructive CAD. 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 It's difficult to determine his functional status due to advanced COPD. Continue treatment with Toprol and lisinopril and  Eplerenone . Check basic metabolic profile today. I do think he would be a good candidate for ICD given his severe COPD.

## 2012-11-14 NOTE — Assessment & Plan Note (Signed)
No symptoms suggestive of angina. Continue medical therapy. 

## 2012-11-14 NOTE — Assessment & Plan Note (Signed)
Blood pressure is now reasonably controlled on current medications. I'm hesitant to increase Toprol given his significant COPD.

## 2012-11-14 NOTE — Patient Instructions (Addendum)
Labs today.   Continue same medications.   Your physician wants you to follow-up in: 6 months.  You will receive a reminder letter in the mail two months in advance. If you don't receive a letter, please call our office to schedule the follow-up appointment.  

## 2012-11-14 NOTE — Progress Notes (Signed)
Primary care physician: Dr. Maryellen Pile  HPI  This is a 62 year old male who is here today for a followup visit regarding chronic systolic heart failure. He has extensive medical problems that include severe COPD with extensive smoking history currently on home oxygen, hypertension, type 2 diabetes, morbid obesity and obstructive sleep apnea. He presented on April 12 to [facility] with worsening dyspnea and hypoxia. He was diagnosed with pneumonia. He was significantly tachycardic on presentation and was also noted to have fluid overload. He had an echocardiogram done which showed an ejection fraction of 25%. He quit smoking at that time. I started him on small dose Toprol and lisinopril for cardiomyopathy. He underwent cardiac catheterization which showed moderate two-vessel disease in the mid RCA and mid left circumflex. Ejection fraction was 35%. His cardiomyopathy was out of proportion to coronary artery disease. His blood pressure was elevated during the catheterization with an LVEDP of 22 mm mercury. Heart failure medications were uptitrated gradually. I added Eplerenone  during last visit which has been well-tolerated.   he is doing well and denies any worsening dyspnea. No orthopnea or PND.     Allergies  Allergen Reactions  . Androgel [Testosterone]     swelling     Current Outpatient Prescriptions on File Prior to Visit  Medication Sig Dispense Refill  . albuterol (PROVENTIL HFA;VENTOLIN HFA) 108 (90 BASE) MCG/ACT inhaler Inhale 2 puffs into the lungs every 4 (four) hours as needed for wheezing.      Marland Kitchen aspirin 81 MG tablet Take 1 tablet (81 mg total) by mouth daily.  30 tablet  0  . eplerenone (INSPRA) 25 MG tablet Take 1 tablet (25 mg total) by mouth daily.  30 tablet  6  . furosemide (LASIX) 40 MG tablet Take 40 mg by mouth daily.       Marland Kitchen glimepiride (AMARYL) 4 MG tablet Take 4 mg by mouth daily before breakfast.      . ipratropium-albuterol (DUONEB) 0.5-2.5 (3) MG/3ML SOLN Take 3 mLs by  nebulization every 4 (four) hours as needed.      Marland Kitchen lisinopril (PRINIVIL,ZESTRIL) 40 MG tablet Take 1 tablet (40 mg total) by mouth daily.  30 tablet  6  . metoprolol succinate (TOPROL-XL) 50 MG 24 hr tablet take 1 tablet by mouth once daily TAKE WITH OR IMMEDIATELY FOLLOWING A MEAL  30 tablet  1  . NON FORMULARY Oxygen 2 liters daily      . pramipexole (MIRAPEX) 1 MG tablet Take 1 mg by mouth daily.      Marland Kitchen tiotropium (SPIRIVA) 18 MCG inhalation capsule Place 18 mcg into inhaler and inhale daily.       No current facility-administered medications on file prior to visit.     Past Medical History  Diagnosis Date  . Diabetes mellitus without complication   . CHF (congestive heart failure)   . COPD (chronic obstructive pulmonary disease)   . OSA (obstructive sleep apnea)   . Morbid obesity   . Chronic respiratory failure   . Cardiomyopathy     EF 25 %  . Hypertension   . Chronic systolic heart failure     EF of 20%  . Coronary artery disease     Cardiac cath in 05/2012: EF 35%, no evidence of obstructive disease with 60% mid left circumflex stenosis and 60% mid RCA stenosis     Past Surgical History  Procedure Laterality Date  . Cataract extraction      right eye  . Hernia  repair    . Cardiac catheterization  06/20/12    [facility];EF 35%     Family History  Problem Relation Age of Onset  . Hypertension Father   . Heart disease Father   . Hyperlipidemia Father      History   Social History  . Marital Status: Married    Spouse Name: N/A    Number of Children: N/A  . Years of Education: N/A   Occupational History  . Not on file.   Social History Main Topics  . Smoking status: Former Smoker -- 1.50 packs/day for 40 years    Types: Cigarettes  . Smokeless tobacco: Former Neurosurgeon    Quit date: 05/12/2012  . Alcohol Use: No  . Drug Use: No  . Sexual Activity: Not on file   Other Topics Concern  . Not on file   Social History Narrative  . No narrative on file         PHYSICAL EXAM   BP 137/80  Pulse 81  Ht 6' (1.829 m)  Wt 244 lb 8 oz (110.904 kg)  BMI 33.15 kg/m2  SpO2 92% Constitutional: He is oriented to person, place, and time. He appears well-developed and well-nourished. No distress.  HENT: No nasal discharge.  Head: Normocephalic and atraumatic.  Eyes: Pupils are equal and round. Right eye exhibits no discharge. Left eye exhibits no discharge.  Neck: Normal range of motion. Neck supple. No JVD present. No thyromegaly present.  Cardiovascular: Normal rate, regular rhythm, normal heart sounds and. Exam reveals no gallop and no friction rub. No murmur heard.  Pulmonary/Chest: Effort normal and breath sounds normal. No stridor. No respiratory distress. He has no wheezes. He has no rales. He exhibits no tenderness.  Abdominal: Soft. Bowel sounds are normal. He exhibits no distension. There is no tenderness. There is no rebound and no guarding.  Musculoskeletal: Normal range of motion. He exhibits trace edema and no tenderness.  Neurological: He is alert and oriented to person, place, and time. Coordination normal.  Skin: Skin is warm and dry. Chronic stasis dermatitis in the lower extremities. He is not diaphoretic. No erythema. No pallor.  Psychiatric: He has a normal mood and affect. His behavior is normal. Judgment and thought content normal.     EKG: Sinus  Rhythm  -First degree A-V block  PRi = 220 Low voltage in limb leads.   -Incomplete right bundle branch block.   ABNORMAL    ASSESSMENT AND PLAN

## 2012-11-15 ENCOUNTER — Telehealth: Payer: Self-pay

## 2012-11-15 LAB — BASIC METABOLIC PANEL
CO2: 26 mmol/L (ref 18–29)
Calcium: 10.2 mg/dL (ref 8.6–10.2)
Creatinine, Ser: 0.6 mg/dL — ABNORMAL LOW (ref 0.76–1.27)
GFR calc non Af Amer: 108 mL/min/{1.73_m2} (ref >59)
Glucose: 133 mg/dL — ABNORMAL HIGH (ref 65–99)
Potassium: 4.6 mmol/L (ref 3.5–5.2)
Sodium: 135 mmol/L (ref 134–144)

## 2012-11-15 NOTE — Telephone Encounter (Signed)
Message copied by Marilynne Halsted on Fri Nov 15, 2012  2:52 PM ------      Message from: Lorine Bears A      Created: Fri Nov 15, 2012  8:59 AM       Inform patient that labs were normal. Continue same medications. ------

## 2012-11-15 NOTE — Telephone Encounter (Signed)
Spoke w/ pt.  He is aware of results.  

## 2012-12-09 ENCOUNTER — Telehealth: Payer: Self-pay | Admitting: *Deleted

## 2012-12-09 NOTE — Telephone Encounter (Signed)
Spoke w/ pt.  He reports that his joints are hurting all over and getting worse over the past two weeks.  No med changes on last visit with Dr. Kirke Corin.  Instructed pt to call PCP and if they feel that problem is cardiac related, they will refer him back over here.  Pt is agreeable to this that states "I didn't know who to call, but I knew y'all would tell me."

## 2012-12-09 NOTE — Telephone Encounter (Signed)
Left message for pt to call back  °

## 2012-12-09 NOTE — Telephone Encounter (Signed)
Patient called and complaining that all his joints hurts. Not sure whats going on. Please advise

## 2012-12-10 ENCOUNTER — Telehealth: Payer: Self-pay

## 2012-12-10 NOTE — Telephone Encounter (Signed)
Spoke w/ pt.  He is agreeable trying Extra Strength Tylenol.

## 2012-12-10 NOTE — Telephone Encounter (Signed)
Spoke w/ Robert Mccarthy.  He reports that he saw Dr. Maryellen Pile for his joint pain who prescribed meloxicam. Robert Mccarthy was previously prescribed this by Dr. Maryellen Pile and Dr. Kirke Corin instructed him to take it short term. Robert Mccarthy was scared to take it and only took it a couple of times.  Reports that he informed Dr. Maryellen Pile that he "can't take it". Robert Mccarthy would like to know from Dr. Kirke Corin what he recommends for his joint pain that will not adversely effect his heart. Please advise.  Thank you!

## 2012-12-10 NOTE — Telephone Encounter (Signed)
Extra strength Acetaminophen (Tylenol) is safe for the heart.

## 2012-12-13 ENCOUNTER — Other Ambulatory Visit: Payer: Self-pay | Admitting: *Deleted

## 2012-12-13 ENCOUNTER — Other Ambulatory Visit: Payer: Self-pay | Admitting: Cardiovascular Disease

## 2012-12-13 MED ORDER — METOPROLOL SUCCINATE ER 50 MG PO TB24: ORAL_TABLET | ORAL | Status: DC

## 2012-12-13 NOTE — Telephone Encounter (Signed)
Requested Prescriptions   Signed Prescriptions Disp Refills  . metoprolol succinate (TOPROL-XL) 50 MG 24 hr tablet 30 tablet 3    Sig: take 1 tablet by mouth once daily TAKE WITH OR IMMEDIATELY FOLLOWING A MEAL    Authorizing Provider: Lorine Bears A    Ordering User: Kendrick Fries

## 2012-12-16 ENCOUNTER — Other Ambulatory Visit: Payer: Self-pay

## 2012-12-16 MED ORDER — METOPROLOL SUCCINATE ER 50 MG PO TB24: ORAL_TABLET | ORAL | Status: DC

## 2012-12-16 NOTE — Telephone Encounter (Signed)
Refill sent for metoprolol succ ER 50 mg

## 2013-02-11 ENCOUNTER — Other Ambulatory Visit: Payer: Self-pay

## 2013-02-11 MED ORDER — LISINOPRIL 40 MG PO TABS: 40.0000 mg | ORAL_TABLET | Freq: Every day | ORAL | Status: DC

## 2013-02-11 NOTE — Telephone Encounter (Signed)
Requested Prescriptions   Signed Prescriptions Disp Refills  . lisinopril (PRINIVIL,ZESTRIL) 40 MG tablet 30 tablet 6    Sig: Take 1 tablet (40 mg total) by mouth daily.    Authorizing Provider: Lorine BearsARIDA, MUHAMMAD A    Ordering User: Kendrick FriesLOPEZ, MARINA C

## 2013-02-18 ENCOUNTER — Telehealth: Payer: Self-pay

## 2013-02-18 NOTE — Telephone Encounter (Signed)
[facility] . States it is a confidential vm if call back and she does not pick up.

## 2013-02-18 NOTE — Telephone Encounter (Signed)
Left detailed message for Lawson Fiscalrystal Bates, RN w/ pt's EF from last ECHO on 05/12/12. Asked her to call w/ any questions or concerns.

## 2013-03-24 ENCOUNTER — Other Ambulatory Visit: Payer: Self-pay

## 2013-03-24 ENCOUNTER — Other Ambulatory Visit: Payer: Self-pay | Admitting: Cardiovascular Disease

## 2013-03-24 MED ORDER — EPLERENONE 25 MG PO TABS: 25.0000 mg | ORAL_TABLET | Freq: Every day | ORAL | Status: DC

## 2013-04-18 ENCOUNTER — Other Ambulatory Visit: Payer: Self-pay

## 2013-04-18 MED ORDER — METOPROLOL SUCCINATE ER 50 MG PO TB24: ORAL_TABLET | ORAL | Status: DC

## 2013-05-15 ENCOUNTER — Encounter: Payer: Self-pay | Admitting: Cardiovascular Disease

## 2013-05-15 ENCOUNTER — Ambulatory Visit (INDEPENDENT_AMBULATORY_CARE_PROVIDER_SITE_OTHER): Payer: Medicare Other | Admitting: Cardiovascular Disease

## 2013-05-15 VITALS — BP 139/88 | HR 67 | Ht 72.0 in | Wt 311.5 lb

## 2013-05-15 DIAGNOSIS — I5022 Chronic systolic (congestive) heart failure: Secondary | ICD-10-CM

## 2013-05-15 DIAGNOSIS — I2510 Atherosclerotic heart disease of native coronary artery without angina pectoris: Secondary | ICD-10-CM

## 2013-05-15 DIAGNOSIS — I10 Essential (primary) hypertension: Secondary | ICD-10-CM

## 2013-05-15 NOTE — Assessment & Plan Note (Signed)
Blood pressure is reasonably controlled on current medications. 

## 2013-05-15 NOTE — Assessment & Plan Note (Signed)
Due to nonischemic cardiomyopathy. Ejection fraction was initially  25% but improved to 35%. Continue treatment with Toprol and lisinopril and  Eplerenone . Check basic metabolic profile today. I do think he would be a good candidate for ICD given his severe COPD. He appears to be euvolemic on current dose of Lasix.

## 2013-05-15 NOTE — Progress Notes (Signed)
Primary care physician: Dr. Maryellen PileEason  HPI  This is a 63 year old male who is here today for a followup visit regarding chronic systolic heart failure. He has extensive medical problems that include severe COPD with extensive smoking history currently on home oxygen, hypertension, type 2 diabetes, morbid obesity and obstructive sleep apnea. [facility] with worsening dyspnea and hypoxia. He was diagnosed with pneumonia. He was significantly tachycardic on presentation and was also noted to have fluid overload. He had an echocardiogram done which showed an ejection fraction of 25%. He quit smoking at that time but relapsed again. Cardiac catheterization in 06/2012 showed moderate two-vessel disease in the mid RCA and mid left circumflex. Ejection fraction was 35%. His cardiomyopathy was out of proportion to coronary artery disease. His blood pressure was elevated during the catheterization with an LVEDP of 22 mm mercury. Heart failure medications were uptitrated gradually.  he is doing well and denies any worsening dyspnea. No orthopnea or PND. He denies chest discomfort. He uses oxygen continuously. Unfortunately, he continues to smoke.    Allergies  Allergen Reactions  . Androgel [Testosterone]     swelling     Current Outpatient Prescriptions on File Prior to Visit  Medication Sig Dispense Refill  . albuterol (PROVENTIL HFA;VENTOLIN HFA) 108 (90 BASE) MCG/ACT inhaler Inhale 2 puffs into the lungs every 4 (four) hours as needed for wheezing.      Marland Kitchen. aspirin 81 MG tablet Take 1 tablet (81 mg total) by mouth daily.  30 tablet  0  . eplerenone (INSPRA) 25 MG tablet Take 1 tablet (25 mg total) by mouth daily.  30 tablet  6  . furosemide (LASIX) 40 MG tablet Take 40 mg by mouth daily.       Marland Kitchen. glimepiride (AMARYL) 4 MG tablet Take 4 mg by mouth daily before breakfast.      . ipratropium-albuterol (DUONEB) 0.5-2.5 (3) MG/3ML SOLN Take 3 mLs by nebulization every 4 (four)  hours as needed.      Marland Kitchen. lisinopril (PRINIVIL,ZESTRIL) 40 MG tablet Take 1 tablet (40 mg total) by mouth daily.  30 tablet  6  . metoprolol succinate (TOPROL-XL) 50 MG 24 hr tablet take 1 tablet by mouth once daily TAKE WITH OR IMMEDIATELY FOLLOWING A MEAL  30 tablet  6  . NON FORMULARY Oxygen 2 liters daily      . pramipexole (MIRAPEX) 1 MG tablet Take 1 mg by mouth daily.      Marland Kitchen. tiotropium (SPIRIVA) 18 MCG inhalation capsule Place 18 mcg into inhaler and inhale daily.       No current facility-administered medications on file prior to visit.     Past Medical History  Diagnosis Date  . Diabetes mellitus without complication   . CHF (congestive heart failure)   . COPD (chronic obstructive pulmonary disease)   . OSA (obstructive sleep apnea)   . Morbid obesity   . Chronic respiratory failure   . Cardiomyopathy     EF 25 %  . Hypertension   . Chronic systolic heart failure     EF of 20%  . Coronary artery disease     Cardiac cath in 05/2012: EF 35%, no evidence of obstructive disease with 60% mid left circumflex stenosis and 60% mid RCA stenosis     Past Surgical History  Procedure Laterality Date  . Cataract extraction      right eye  . Hernia repair    . Cardiac catheterization  06/20/12    [facility];EF 35%     Family History  Problem Relation Age of Onset  . Hypertension Father   . Heart disease Father   . Hyperlipidemia Father      History   Social History  . Marital Status: Married    Spouse Name: N/A    Number of Children: N/A  . Years of Education: N/A   Occupational History  . Not on file.   Social History Main Topics  . Smoking status: Current Every Day Smoker -- 0.50 packs/day for 40 years    Types: Cigarettes  . Smokeless tobacco: Former NeurosurgeonUser    Quit date: 05/12/2012  . Alcohol Use: No  . Drug Use: No  . Sexual Activity: Not on file   Other Topics Concern  . Not on file   Social History Narrative  . No narrative on file        PHYSICAL  EXAM   BP 139/88  Pulse 67  Ht 6' (1.829 m)  Wt 311 lb 8 oz (141.295 kg)  BMI 42.24 kg/m2 Constitutional: He is oriented to person, place, and time. He appears well-developed and well-nourished. No distress.  HENT: No nasal discharge.  Head: Normocephalic and atraumatic.  Eyes: Pupils are equal and round. Right eye exhibits no discharge. Left eye exhibits no discharge.  Neck: Normal range of motion. Neck supple. No JVD present. No thyromegaly present.  Cardiovascular: Normal rate, regular rhythm, normal heart sounds and. Exam reveals no gallop and no friction rub. No murmur heard.  Pulmonary/Chest: Effort normal and breath sounds normal. No stridor. No respiratory distress. He has no wheezes. He has no rales. He exhibits no tenderness.  Abdominal: Soft. Bowel sounds are normal. He exhibits no distension. There is no tenderness. There is no rebound and no guarding.  Musculoskeletal: Normal range of motion. He exhibits trace edema and no tenderness.  Neurological: He is alert and oriented to person, place, and time. Coordination normal.  Skin: Skin is warm and dry. Chronic stasis dermatitis in the lower extremities. He is not diaphoretic. No erythema. No pallor.  Psychiatric: He has a normal mood and affect. His behavior is normal. Judgment and thought content normal.     EKG: Sinus  Rhythm  -First degree A-V block  PRi = 220 Low voltage in limb leads.   -Incomplete right bundle branch block.   ABNORMAL    ASSESSMENT AND PLAN

## 2013-05-15 NOTE — Patient Instructions (Signed)
Labs today.   Continue same medications.   Your physician wants you to follow-up in: 6 months.  You will receive a reminder letter in the mail two months in advance. If you don't receive a letter, please call our office to schedule the follow-up appointment.  

## 2013-05-15 NOTE — Assessment & Plan Note (Addendum)
He has no symptoms suggestive of angina. Continue medical therapy. We should consider treatment with a statin given presence of coronary artery disease and also is diabetic.

## 2013-05-16 LAB — BASIC METABOLIC PANEL
BUN / CREAT RATIO: 19 (ref 10–22)
BUN: 12 mg/dL (ref 8–27)
CHLORIDE: 92 mmol/L — AB (ref 97–108)
CO2: 27 mmol/L (ref 18–29)
Calcium: 10.1 mg/dL (ref 8.6–10.2)
Creatinine, Ser: 0.62 mg/dL — ABNORMAL LOW (ref 0.76–1.27)
GFR calc non Af Amer: 106 mL/min/{1.73_m2} (ref >59)
GFR, EST AFRICAN AMERICAN: 123 mL/min/{1.73_m2} (ref >59)
Glucose: 55 mg/dL — ABNORMAL LOW (ref 65–99)
Potassium: 5.2 mmol/L (ref 3.5–5.2)
Sodium: 138 mmol/L (ref 134–144)

## 2013-07-29 ENCOUNTER — Inpatient Hospital Stay: Payer: Self-pay | Admitting: Family Medicine

## 2013-07-29 LAB — CBC WITH DIFFERENTIAL/PLATELET
Basophil #: 0.1 10*3/uL (ref 0.0–0.1)
Basophil %: 0.5 %
EOS PCT: 0.3 %
Eosinophil #: 0 10*3/uL (ref 0.0–0.7)
HCT: 53.4 % — ABNORMAL HIGH (ref 40.0–52.0)
HGB: 17.4 g/dL (ref 13.0–18.0)
Lymphocyte #: 0.6 10*3/uL — ABNORMAL LOW (ref 1.0–3.6)
Lymphocyte %: 5.7 %
MCH: 32.4 pg (ref 26.0–34.0)
MCHC: 32.5 g/dL (ref 32.0–36.0)
MCV: 100 fL (ref 80–100)
Monocyte #: 0.3 x10 3/mm (ref 0.2–1.0)
Monocyte %: 3.4 %
NEUTROS ABS: 9.2 10*3/uL — AB (ref 1.4–6.5)
Neutrophil %: 90.1 %
Platelet: 195 10*3/uL (ref 150–440)
RBC: 5.36 10*6/uL (ref 4.40–5.90)
RDW: 15.1 % — ABNORMAL HIGH (ref 11.5–14.5)
WBC: 10.2 10*3/uL (ref 3.8–10.6)

## 2013-07-29 LAB — COMPREHENSIVE METABOLIC PANEL
ALK PHOS: 94 U/L
ANION GAP: 8 (ref 7–16)
Albumin: 3.2 g/dL — ABNORMAL LOW (ref 3.4–5.0)
BUN: 21 mg/dL — ABNORMAL HIGH (ref 7–18)
Bilirubin,Total: 1.6 mg/dL — ABNORMAL HIGH (ref 0.2–1.0)
CREATININE: 0.77 mg/dL (ref 0.60–1.30)
Calcium, Total: 10 mg/dL (ref 8.5–10.1)
Chloride: 91 mmol/L — ABNORMAL LOW (ref 98–107)
Co2: 33 mmol/L — ABNORMAL HIGH (ref 21–32)
EGFR (African American): >60
Glucose: 124 mg/dL — ABNORMAL HIGH (ref 65–99)
Osmolality: 269 (ref 275–301)
Potassium: 5.3 mmol/L — ABNORMAL HIGH (ref 3.5–5.1)
SGOT(AST): 21 U/L (ref 15–37)
SGPT (ALT): 22 U/L (ref 12–78)
SODIUM: 132 mmol/L — AB (ref 136–145)
Total Protein: 7.8 g/dL (ref 6.4–8.2)

## 2013-07-29 LAB — TROPONIN I: Troponin-I: <0.02 ng/mL

## 2013-07-29 LAB — URINALYSIS, COMPLETE
Bilirubin,UR: NEGATIVE
Glucose,UR: NEGATIVE mg/dL (ref 0–75)
Ketone: NEGATIVE
Leukocyte Esterase: NEGATIVE
NITRITE: NEGATIVE
Ph: 5 (ref 4.5–8.0)
Protein: >=500
Specific Gravity: 1.025 (ref 1.003–1.030)
Squamous Epithelial: NONE SEEN
WBC UR: 4 /HPF (ref 0–5)

## 2013-07-29 LAB — MAGNESIUM: Magnesium: 1.1 mg/dL — ABNORMAL LOW

## 2013-07-29 LAB — PROTIME-INR
INR: 1.2
Prothrombin Time: 14.7 secs (ref 11.5–14.7)

## 2013-07-29 LAB — PHOSPHORUS: PHOSPHORUS: 2.8 mg/dL (ref 2.5–4.9)

## 2013-07-29 NOTE — Telephone Encounter (Signed)
This encounter was created in error - please disregard.

## 2013-07-30 LAB — COMPREHENSIVE METABOLIC PANEL
ALK PHOS: 73 U/L
ALT: 21 U/L (ref 12–78)
Albumin: 2.3 g/dL — ABNORMAL LOW (ref 3.4–5.0)
Anion Gap: 5 — ABNORMAL LOW (ref 7–16)
BUN: 19 mg/dL — AB (ref 7–18)
Bilirubin,Total: 1.2 mg/dL — ABNORMAL HIGH (ref 0.2–1.0)
Calcium, Total: 8.6 mg/dL (ref 8.5–10.1)
Chloride: 96 mmol/L — ABNORMAL LOW (ref 98–107)
Co2: 30 mmol/L (ref 21–32)
Creatinine: 0.84 mg/dL (ref 0.60–1.30)
EGFR (African American): >60
EGFR (Non-African Amer.): >60
GLUCOSE: 246 mg/dL — AB (ref 65–99)
OSMOLALITY: 273 (ref 275–301)
POTASSIUM: 4.6 mmol/L (ref 3.5–5.1)
SGOT(AST): 26 U/L (ref 15–37)
Sodium: 131 mmol/L — ABNORMAL LOW (ref 136–145)
Total Protein: 6.7 g/dL (ref 6.4–8.2)

## 2013-07-30 LAB — CBC WITH DIFFERENTIAL/PLATELET
BASOS PCT: 0.1 %
Basophil #: 0 10*3/uL (ref 0.0–0.1)
Eosinophil #: 0 10*3/uL (ref 0.0–0.7)
Eosinophil %: 0 %
HCT: 48.4 % (ref 40.0–52.0)
HGB: 15 g/dL (ref 13.0–18.0)
Lymphocyte #: 0.3 10*3/uL — ABNORMAL LOW (ref 1.0–3.6)
Lymphocyte %: 1.8 %
MCH: 30.6 pg (ref 26.0–34.0)
MCHC: 30.9 g/dL — AB (ref 32.0–36.0)
MCV: 99 fL (ref 80–100)
MONO ABS: 0.2 x10 3/mm (ref 0.2–1.0)
Monocyte %: 1.3 %
NEUTROS PCT: 96.8 %
Neutrophil #: 15.2 10*3/uL — ABNORMAL HIGH (ref 1.4–6.5)
Platelet: 171 10*3/uL (ref 150–440)
RBC: 4.89 10*6/uL (ref 4.40–5.90)
RDW: 15.6 % — ABNORMAL HIGH (ref 11.5–14.5)
WBC: 15.7 10*3/uL — ABNORMAL HIGH (ref 3.8–10.6)

## 2013-07-30 LAB — LIPID PANEL
CHOLESTEROL: 89 mg/dL (ref 0–200)
HDL Cholesterol: 25 mg/dL — ABNORMAL LOW (ref 40–60)
LDL CHOLESTEROL, CALC: 54 mg/dL (ref 0–100)
Triglycerides: 48 mg/dL (ref 0–200)
VLDL CHOLESTEROL, CALC: 10 mg/dL (ref 5–40)

## 2013-07-30 LAB — TSH: THYROID STIMULATING HORM: 0.897 u[IU]/mL

## 2013-07-30 LAB — MAGNESIUM: MAGNESIUM: 1.8 mg/dL

## 2013-07-30 LAB — SEDIMENTATION RATE: Erythrocyte Sed Rate: 1 mm/hr (ref 0–20)

## 2013-07-30 LAB — HEMOGLOBIN A1C: Hemoglobin A1C: 6.3 % (ref 4.2–6.3)

## 2013-07-31 LAB — URINE CULTURE

## 2013-07-31 LAB — VANCOMYCIN, TROUGH: Vancomycin, Trough: 11 ug/mL (ref 10–20)

## 2013-08-01 LAB — CBC WITH DIFFERENTIAL/PLATELET
BASOS ABS: 0 10*3/uL (ref 0.0–0.1)
Basophil %: 0.1 %
Eosinophil #: 0 10*3/uL (ref 0.0–0.7)
Eosinophil %: 0 %
HCT: 48.7 % (ref 40.0–52.0)
HGB: 15.6 g/dL (ref 13.0–18.0)
LYMPHS ABS: 0.4 10*3/uL — AB (ref 1.0–3.6)
Lymphocyte %: 3.4 %
MCH: 31.9 pg (ref 26.0–34.0)
MCHC: 32.1 g/dL (ref 32.0–36.0)
MCV: 100 fL (ref 80–100)
MONOS PCT: 3.3 %
Monocyte #: 0.4 x10 3/mm (ref 0.2–1.0)
NEUTROS PCT: 93.2 %
Neutrophil #: 10 10*3/uL — ABNORMAL HIGH (ref 1.4–6.5)
PLATELETS: 172 10*3/uL (ref 150–440)
RBC: 4.9 10*6/uL (ref 4.40–5.90)
RDW: 15.4 % — ABNORMAL HIGH (ref 11.5–14.5)
WBC: 10.7 10*3/uL — AB (ref 3.8–10.6)

## 2013-08-02 LAB — CREATININE, SERUM
CREATININE: 0.63 mg/dL (ref 0.60–1.30)
EGFR (Non-African Amer.): >60

## 2013-08-02 LAB — VANCOMYCIN, TROUGH: VANCOMYCIN, TROUGH: 14 ug/mL (ref 10–20)

## 2013-08-03 LAB — CBC WITH DIFFERENTIAL/PLATELET
Basophil #: 0 10*3/uL (ref 0.0–0.1)
Basophil %: 0.2 %
EOS ABS: 0 10*3/uL (ref 0.0–0.7)
Eosinophil %: 0.2 %
HCT: 49 % (ref 40.0–52.0)
HGB: 15.7 g/dL (ref 13.0–18.0)
LYMPHS ABS: 1.2 10*3/uL (ref 1.0–3.6)
Lymphocyte %: 13.6 %
MCH: 32.2 pg (ref 26.0–34.0)
MCHC: 32.1 g/dL (ref 32.0–36.0)
MCV: 100 fL (ref 80–100)
MONO ABS: 0.9 x10 3/mm (ref 0.2–1.0)
MONOS PCT: 9.9 %
NEUTROS PCT: 76.1 %
Neutrophil #: 6.6 10*3/uL — ABNORMAL HIGH (ref 1.4–6.5)
PLATELETS: 136 10*3/uL — AB (ref 150–440)
RBC: 4.89 10*6/uL (ref 4.40–5.90)
RDW: 15 % — ABNORMAL HIGH (ref 11.5–14.5)
WBC: 8.7 10*3/uL (ref 3.8–10.6)

## 2013-08-03 LAB — BASIC METABOLIC PANEL WITH GFR
Anion Gap: 0 — ABNORMAL LOW
BUN: 13 mg/dL
Calcium, Total: 10 mg/dL
Chloride: 94 mmol/L — ABNORMAL LOW
Co2: 42 mmol/L
Creatinine: 0.77 mg/dL
EGFR (African American): >60
EGFR (Non-African Amer.): >60
Glucose: 155 mg/dL — ABNORMAL HIGH
Osmolality: 275
Potassium: 4.4 mmol/L
Sodium: 136 mmol/L

## 2013-08-03 LAB — VANCOMYCIN, TROUGH: Vancomycin, Trough: 16 ug/mL

## 2013-08-04 LAB — CREATININE, SERUM
Creatinine: 0.65 mg/dL (ref 0.60–1.30)
EGFR (Non-African Amer.): >60

## 2013-08-04 NOTE — Telephone Encounter (Signed)
This encounter was created in error - please disregard.

## 2013-08-05 LAB — PATHOLOGY REPORT

## 2013-08-06 LAB — CULTURE, BLOOD (SINGLE)

## 2013-08-06 LAB — WOUND CULTURE

## 2013-08-17 ENCOUNTER — Other Ambulatory Visit: Payer: Self-pay

## 2013-08-17 LAB — VANCOMYCIN, TROUGH: VANCOMYCIN, TROUGH: 15 ug/mL (ref 10–20)

## 2013-10-06 ENCOUNTER — Other Ambulatory Visit: Payer: Self-pay | Admitting: Cardiovascular Disease

## 2013-10-21 ENCOUNTER — Inpatient Hospital Stay: Payer: Self-pay | Admitting: Internal Medicine

## 2013-10-21 DIAGNOSIS — J9620 Acute and chronic respiratory failure, unspecified whether with hypoxia or hypercapnia: Secondary | ICD-10-CM

## 2013-10-21 DIAGNOSIS — I2510 Atherosclerotic heart disease of native coronary artery without angina pectoris: Secondary | ICD-10-CM

## 2013-10-21 DIAGNOSIS — I509 Heart failure, unspecified: Secondary | ICD-10-CM

## 2013-10-21 LAB — COMPREHENSIVE METABOLIC PANEL
ANION GAP: 1 — AB (ref 7–16)
Albumin: 3.4 g/dL (ref 3.4–5.0)
Alkaline Phosphatase: 77 U/L
BILIRUBIN TOTAL: 0.7 mg/dL (ref 0.2–1.0)
BUN: 14 mg/dL (ref 7–18)
CHLORIDE: 95 mmol/L — AB (ref 98–107)
Calcium, Total: 9.2 mg/dL (ref 8.5–10.1)
Co2: 39 mmol/L — ABNORMAL HIGH (ref 21–32)
Creatinine: 0.83 mg/dL (ref 0.60–1.30)
EGFR (Non-African Amer.): >60
GLUCOSE: 104 mg/dL — AB (ref 65–99)
OSMOLALITY: 271 (ref 275–301)
Potassium: 4.2 mmol/L (ref 3.5–5.1)
SGOT(AST): 26 U/L (ref 15–37)
SGPT (ALT): 22 U/L
Sodium: 135 mmol/L — ABNORMAL LOW (ref 136–145)
Total Protein: 7.5 g/dL (ref 6.4–8.2)

## 2013-10-21 LAB — CBC
HCT: 48.2 % (ref 40.0–52.0)
HGB: 15.3 g/dL (ref 13.0–18.0)
MCH: 31.8 pg (ref 26.0–34.0)
MCHC: 31.8 g/dL — ABNORMAL LOW (ref 32.0–36.0)
MCV: 100 fL (ref 80–100)
Platelet: 161 10*3/uL (ref 150–440)
RBC: 4.81 10*6/uL (ref 4.40–5.90)
RDW: 17.8 % — ABNORMAL HIGH (ref 11.5–14.5)
WBC: 7 10*3/uL (ref 3.8–10.6)

## 2013-10-21 LAB — TROPONIN I: Troponin-I: <0.02 ng/mL

## 2013-10-21 LAB — PRO B NATRIURETIC PEPTIDE: B-Type Natriuretic Peptide: 1808 pg/mL — ABNORMAL HIGH (ref 0–125)

## 2013-10-21 LAB — PROTIME-INR
INR: 1.2
Prothrombin Time: 14.6 secs (ref 11.5–14.7)

## 2013-10-21 LAB — APTT: Activated PTT: 31.8 secs (ref 23.6–35.9)

## 2013-10-22 LAB — CBC WITH DIFFERENTIAL/PLATELET
Basophil #: 0 10*3/uL (ref 0.0–0.1)
Basophil %: 0.3 %
EOS ABS: 0 10*3/uL (ref 0.0–0.7)
EOS PCT: 0.1 %
HCT: 43.6 % (ref 40.0–52.0)
HGB: 14.1 g/dL (ref 13.0–18.0)
LYMPHS ABS: 0.4 10*3/uL — AB (ref 1.0–3.6)
Lymphocyte %: 11.4 %
MCH: 32.2 pg (ref 26.0–34.0)
MCHC: 32.4 g/dL (ref 32.0–36.0)
MCV: 99 fL (ref 80–100)
MONO ABS: 0.1 x10 3/mm — AB (ref 0.2–1.0)
Monocyte %: 3.7 %
Neutrophil #: 2.8 10*3/uL (ref 1.4–6.5)
Neutrophil %: 84.5 %
Platelet: 138 10*3/uL — ABNORMAL LOW (ref 150–440)
RBC: 4.39 10*6/uL — AB (ref 4.40–5.90)
RDW: 17.3 % — AB (ref 11.5–14.5)
WBC: 3.3 10*3/uL — AB (ref 3.8–10.6)

## 2013-10-22 LAB — BASIC METABOLIC PANEL
BUN: 15 mg/dL (ref 7–18)
CALCIUM: 8.5 mg/dL (ref 8.5–10.1)
CHLORIDE: 91 mmol/L — AB (ref 98–107)
CO2: 42 mmol/L — AB (ref 21–32)
Creatinine: 0.88 mg/dL (ref 0.60–1.30)
EGFR (African American): >60
Glucose: 206 mg/dL — ABNORMAL HIGH (ref 65–99)
OSMOLALITY: 273 (ref 275–301)
Potassium: 4.1 mmol/L (ref 3.5–5.1)
Sodium: 133 mmol/L — ABNORMAL LOW (ref 136–145)

## 2013-10-22 LAB — TSH: THYROID STIMULATING HORM: 0.69 u[IU]/mL

## 2013-10-22 LAB — TROPONIN I

## 2013-10-22 LAB — HEMOGLOBIN A1C: Hemoglobin A1C: 5.8 % (ref 4.2–6.3)

## 2013-10-23 ENCOUNTER — Telehealth: Payer: Self-pay

## 2013-10-23 DIAGNOSIS — I5021 Acute systolic (congestive) heart failure: Secondary | ICD-10-CM

## 2013-10-23 DIAGNOSIS — I517 Cardiomegaly: Secondary | ICD-10-CM

## 2013-10-23 LAB — BASIC METABOLIC PANEL
ANION GAP: 1 — AB (ref 7–16)
BUN: 15 mg/dL (ref 7–18)
Calcium, Total: 9.2 mg/dL (ref 8.5–10.1)
Chloride: 92 mmol/L — ABNORMAL LOW (ref 98–107)
Co2: 44 mmol/L (ref 21–32)
Creatinine: 0.81 mg/dL (ref 0.60–1.30)
EGFR (African American): >60
Glucose: 186 mg/dL — ABNORMAL HIGH (ref 65–99)
Osmolality: 280 (ref 275–301)
Potassium: 4.1 mmol/L (ref 3.5–5.1)
Sodium: 137 mmol/L (ref 136–145)

## 2013-10-23 NOTE — Telephone Encounter (Signed)
Patient contacted regarding discharge from [facility] on 10/23/13.  Patient understands to follow up with Eula Listen, PA on 10/30/13 at 1:15 at [facility]. Patient understands discharge instructions? yes Patient understands medications and regiment? yes Patient understands to bring all medications to this visit? yes

## 2013-10-27 ENCOUNTER — Encounter: Payer: Self-pay | Admitting: Physician Assistant

## 2013-10-30 ENCOUNTER — Encounter: Payer: Medicare Other | Admitting: Physician Assistant

## 2013-11-03 ENCOUNTER — Encounter: Payer: Self-pay | Admitting: Physician Assistant

## 2013-11-03 ENCOUNTER — Ambulatory Visit (INDEPENDENT_AMBULATORY_CARE_PROVIDER_SITE_OTHER): Payer: Medicare Other | Admitting: Physician Assistant

## 2013-11-03 VITALS — BP 120/62 | HR 87 | Ht 72.0 in | Wt 315.2 lb

## 2013-11-03 DIAGNOSIS — I5022 Chronic systolic (congestive) heart failure: Secondary | ICD-10-CM

## 2013-11-03 DIAGNOSIS — J9611 Chronic respiratory failure with hypoxia: Secondary | ICD-10-CM

## 2013-11-03 DIAGNOSIS — I10 Essential (primary) hypertension: Secondary | ICD-10-CM

## 2013-11-03 DIAGNOSIS — R0602 Shortness of breath: Secondary | ICD-10-CM

## 2013-11-03 DIAGNOSIS — I2510 Atherosclerotic heart disease of native coronary artery without angina pectoris: Secondary | ICD-10-CM

## 2013-11-03 NOTE — Patient Instructions (Signed)
A chest x-ray takes a picture of the organs and structures inside the chest, including the heart, lungs, and blood vessels. This test can show several things, including, whether the heart is enlarges; whether fluid is building up in the lungs; and whether pacemaker / defibrillator leads are still in place.  Your physician recommends that you have labs today:  BMP  Please take lab and chest x ray order to [facility] today or tomorrow   Please schedule a follow up with your primary care provider as soon as possible  Your physician recommends that you continue on your current medications as directed. Please refer to the Current Medication list given to you today.  We will call you for medication changes and follow up after your chest x ray and lab.

## 2013-11-03 NOTE — Progress Notes (Signed)
Patient Name: Robert BOTHWELL Sr., DOB August 11, 1950, MRN [id number]  Date of Encounter: 11/03/2013  Primary Care Provider:  Toy Cookey, MD Primary Cardiologist:  Dr. Kirke Corin, MD  Patient Profile:  63 y.o. male with history of CAD, chronic systolic CHF, chronic respiratory failure on home O2, COPD, HTN, DM2, OSA, extensive smoking history who is here for hospital follow up after recent admission from 9/22-9/24 at [facility] for acute on chronic respiratory failure and acute on chonic systolic CHF.   Problem List:   Past Medical History  Diagnosis Date  . DM2 (diabetes mellitus, type 2)   . COPD (chronic obstructive pulmonary disease)   . OSA (obstructive sleep apnea)     a. BiPAP nightly  . Morbid obesity   . Hypertension   . Chronic systolic heart failure     a. echo 05/12/12: EF of 20-25%, mod-severely increased LV internal cavity size, mod dilated LA, mildly dialted RA, mild-mod MR, mild Ao sclerosis; b. echo 10/23/13: EF 55-60%, mild concentric LVH, mildly dilated LA/RA, mild-mod Ao sclerosis w/o stenosis  . Coronary artery disease     a. cardiac cath: 05/2012: EF 35%, no evidence of obstructive disease w/ mLCx 60% and mRCA 60%  . Chronic respiratory failure     a. on 4L O2 at home  . Tobacco abuse   . Pneumonia    Past Surgical History  Procedure Laterality Date  . Cataract extraction      right eye  . Hernia repair    . Cardiac catheterization  06/20/12    [facility];EF 35%     Allergies:  Allergies  Allergen Reactions  . Androgel [Testosterone] Shortness Of Breath    Other reaction(s): Localized superficial swelling of skin swelling     HPI:  63 y.o. male with the above problem list who is here for hospital follow up after recent admission to [facility] from 9/22-9/24 for acute on chronic respiratory failure and acute on chronic systolic CHF.  He was last seen in our office on 05/15/2013 for follow up of his chronic systolic CHF and was doing well at that time, denying any  symptoms of worsening dyspnea, orthopnea, or PND. His weight at that time was 311 pounds. He was continuing to smoke at that time, in the setting of home O2. His EF was initially 25%, but improved to 35% per office note. It was recommended that he continue Toprolol, lisinopril, & Eplerenone. There was some consideration for possible ICD in the future.   Last cardiac cath 05/2012 revealing EF 35%, non obstructive disease with 60% stenosis mLCx & 60% stenosis mRCA.   He presented to [facility] on 10/21/13 with complaint of increased SOB, DOE, and productive cough of thick white to green sputum for the past 3-4 days. Stated he was in his usual state of health until "he caught this cold." His weight at home had been consistently around 316 he reported with his last weigh in 10/20/13. He reported mild bilateral LEE. He sleeps with one pillow at night, but does have the bed elevated some for comfort only. He is able to sleep/breathe without issues supine. He denied ever haveing any chest pain, nausea, vomiting, diaphoresis, presyncope, or syncope. He denied any fevers or chills. He reported that he has quite smoking on 10/21/13.   Upon arrival to [facility] he was found to be tachycardiac and SOB and had to be placed on BiPAP for a short while. Labs were significant for ProBNP 1808, TnI negative x 4, SCr 0.83,  K+ 4.2, CXR: Pulmonary vascular congestion unchanged compared to prior exam. Cardiomegaly. Upon admission he received SoluMedrol 125 mg IV, DuoNeb, Azithromycin, Levaquin, Lasix 20 mg IV. He put out 4590 mL for the admission. Echo showed EF 55-60%, mild concentric LVH, mildly dilated LA and RA, mild to moderate aortic valve sclerosis/calcification without any evidence of aortic stenosis. He was discharged on a prednisone taper 50 mg, decreasing 10 mg q 2 days, Levaquin 500 mg, metoprolol 50 mg daily, lisinopril 40 mg, Eplerenone 25 mg daily, and Lasix 40 mg daily.   He comes in today stating he has felt well since his  discharge. He denied SOB 3 times when questioning, then finally he admits that today he does feel mildly SOB, however today is the first day he has felt SOB since his discharge. Currently on 3L via oxygen concentrator, usually 2L. His weight is stable since his discharge. Finished his prednisone taper 10/4 and Levaquin this AM. Cough is much improved, but still does cough up some green sputum occasionally. Still not smoking. Taking his medications as directed. Afebrile. No chills. No chest pain.       Home Medications:  Prior to Admission medications   Medication Sig Start Date End Date Taking? Authorizing Provider  albuterol (PROVENTIL HFA;VENTOLIN HFA) 108 (90 BASE) MCG/ACT inhaler Inhale 2 puffs into the lungs every 4 (four) hours as needed for wheezing.    Historical Provider, MD  aspirin 81 MG tablet Take 1 tablet (81 mg total) by mouth daily. 05/20/12   Iran OuchMuhammad A Arida, MD  eplerenone (INSPRA) 25 MG tablet Take 1 tablet (25 mg total) by mouth daily. 03/24/13   Iran OuchMuhammad A Arida, MD  furosemide (LASIX) 40 MG tablet Take 40 mg by mouth daily.     Historical Provider, MD  glimepiride (AMARYL) 4 MG tablet Take 4 mg by mouth daily before breakfast.    Historical Provider, MD  ipratropium-albuterol (DUONEB) 0.5-2.5 (3) MG/3ML SOLN Take 3 mLs by nebulization every 4 (four) hours as needed.    Historical Provider, MD  lisinopril (PRINIVIL,ZESTRIL) 40 MG tablet take 1 tablet by mouth once daily **INSURANCE REQUESTS 90 DAY SUPPLY.OUTCOMES.** 10/07/13   Iran OuchMuhammad A Arida, MD  metoprolol succinate (TOPROL-XL) 50 MG 24 hr tablet take 1 tablet by mouth once daily TAKE WITH OR IMMEDIATELY FOLLOWING A MEAL 04/18/13   Iran OuchMuhammad A Arida, MD  NON FORMULARY Oxygen 2 liters daily    Historical Provider, MD  pramipexole (MIRAPEX) 1 MG tablet Take 1 mg by mouth daily.    Historical Provider, MD  tiotropium (SPIRIVA) 18 MCG inhalation capsule Place 18 mcg into inhaler and inhale daily.    Historical Provider, MD      Weights: Filed Weights   11/03/13 1318  Weight: 315 lb 4 oz (142.996 kg)     Review of Systems: Constitutional: negative for fevers, chills, myalgias, and fatigue, weight changes, and night sweats.  HEENT: negative for vision changes and rhinorrhea  Cardiovascular: negative for chest pain and palpitations  Respiratory: see above Abdominal: negative for abdominal pain, nausea, vomiting, and diarrhea Dermatologic:negative for rash Neurologic: negative for headache. negative for dizziness  All other systems reviewed and are otherwise negative except as noted above.  Physical Exam:  Blood pressure 120/62, pulse 87, height 6' (1.829 m), weight 315 lb 4 oz (142.996 kg).  General: Pleasant, NAD, on 3L [place].  Psych: Normal affect. Neuro: Alert and oriented X 3. Moves all extremities spontaneously. HEENT: Normal  Neck: Supple without bruits or JVD. Lungs:  Resp regular and unlabored, mild wheezing bilateral apices, decreased breath sounds left base. Heart: RRR no s3, s4, or murmurs. Abdomen: Soft, non-tender, non-distended, BS + x 4.  Extremities: No clubbing, cyanosis or edema. Chronic venous stasis.    Accessory Clinical Findings:  EKG - NSR, 87, right axis, poor R wave progression, frequent ectopic ventricular beats  Assessment & Plan:  1. Chronic systolic CHF: -EF had improved to 55-60% by echo on 10/23/13 from a prior of 35% -Has been doing well since his hospital discharge, though with some SOB today -Check CXR and BMET -Titrate medications pending CXR - currently on eplerenone 25 mg, Lasix 40 mg daily, Toprol-XL 50 mg, & lisinopril 40 mg  2. CAD: -Cath 05/2012 with non obstructive disease, 60% stenosis mLCx & mRCA -No angina  -Continue aspirin 81 mg, Toprol-XL 50 mg  3. Chronic respiratory failure:  -On 2L [place] at baseline at home -Has completed his prednisone taper on 10/4 and Levaquin 10/5 -Check CXR - treatment pending CXR -Continue inhalers   -Patient  instructed to call PCP for hospital follow up appointment   4. DM2: -Follow up with PCP, advised patient to call and schedule appointment   5. Tobacco abuse: -Still not smoking -Kudos      Eula Listen, PA-C [facility] [address] [place], [place] [address] [phone] [facility] 11/03/2013, 1:30 PM

## 2013-11-04 ENCOUNTER — Ambulatory Visit: Payer: Self-pay | Admitting: Physician Assistant

## 2013-11-04 ENCOUNTER — Other Ambulatory Visit: Payer: Self-pay | Admitting: *Deleted

## 2013-11-04 ENCOUNTER — Telehealth: Payer: Self-pay | Admitting: Cardiovascular Disease

## 2013-11-04 LAB — BASIC METABOLIC PANEL
Anion Gap: 0 — ABNORMAL LOW (ref 7–16)
BUN: 28 mg/dL — AB (ref 7–18)
CO2: 39 mmol/L — AB (ref 21–32)
CREATININE: 0.81 mg/dL (ref 0.60–1.30)
Calcium, Total: 9.7 mg/dL (ref 8.5–10.1)
Chloride: 95 mmol/L — ABNORMAL LOW (ref 98–107)
EGFR (Non-African Amer.): >60
Glucose: 76 mg/dL (ref 65–99)
Osmolality: 267 (ref 275–301)
Potassium: 5.5 mmol/L — ABNORMAL HIGH (ref 3.5–5.1)
Sodium: 131 mmol/L — ABNORMAL LOW (ref 136–145)

## 2013-11-04 MED ORDER — EPLERENONE 25 MG PO TABS: 25.0000 mg | ORAL_TABLET | Freq: Every day | ORAL | Status: DC

## 2013-11-04 MED ORDER — LISINOPRIL 40 MG PO TABS: ORAL_TABLET | ORAL | Status: DC

## 2013-11-04 MED ORDER — METOPROLOL SUCCINATE ER 50 MG PO TB24: ORAL_TABLET | ORAL | Status: AC

## 2013-11-04 NOTE — Telephone Encounter (Signed)
Pt needs refills on  losoinpril 40 mg  Metoprolol   Epelrenone 25mg   [facility] [address]

## 2013-11-04 NOTE — Telephone Encounter (Signed)
Refills sent into [facility] pharmacy.

## 2013-11-05 ENCOUNTER — Encounter: Payer: Self-pay | Admitting: Physician Assistant

## 2013-11-05 ENCOUNTER — Other Ambulatory Visit: Payer: Self-pay

## 2013-11-05 DIAGNOSIS — I5022 Chronic systolic (congestive) heart failure: Secondary | ICD-10-CM

## 2013-11-06 ENCOUNTER — Ambulatory Visit: Payer: Self-pay | Admitting: Family

## 2013-11-13 ENCOUNTER — Ambulatory Visit (INDEPENDENT_AMBULATORY_CARE_PROVIDER_SITE_OTHER): Payer: Medicare Other | Admitting: Cardiovascular Disease

## 2013-11-13 ENCOUNTER — Other Ambulatory Visit: Payer: Medicare Other

## 2013-11-13 ENCOUNTER — Encounter: Payer: Self-pay | Admitting: Cardiovascular Disease

## 2013-11-13 VITALS — BP 149/90 | HR 89 | Ht 72.0 in | Wt 316.5 lb

## 2013-11-13 DIAGNOSIS — I5022 Chronic systolic (congestive) heart failure: Secondary | ICD-10-CM

## 2013-11-13 DIAGNOSIS — I2510 Atherosclerotic heart disease of native coronary artery without angina pectoris: Secondary | ICD-10-CM

## 2013-11-13 DIAGNOSIS — I10 Essential (primary) hypertension: Secondary | ICD-10-CM

## 2013-11-13 MED ORDER — LISINOPRIL 40 MG PO TABS: ORAL_TABLET | ORAL | Status: AC

## 2013-11-13 NOTE — Patient Instructions (Addendum)
Your physician has recommended you make the following change in your medication:  Stop Inspra  Restart Lisinopril 40 mg once daily   Your physician recommends that you have labs today:  BMP  Your physician recommends that you schedule a follow-up appointment in:  3 months   Your next appointment will be scheduled in our new office located at :  [facility]  [address], [address]  [place], [place] [address]

## 2013-11-13 NOTE — Progress Notes (Signed)
Primary care physician: Dr. Maryellen PileEason  HPI  This is a 63 year old male who is here today for a followup visit regarding chronic systolic heart failure. He has extensive medical problems that include severe COPD with extensive smoking history currently on home oxygen, hypertension, type 2 diabetes, morbid obesity and obstructive sleep apnea. [facility] April [facility] with worsening dyspnea and hypoxia. He was diagnosed with pneumonia. He was significantly tachycardic on presentation and was also noted to have fluid overload. He had an echocardiogram done which showed an ejection fraction of 25%. He quit smoking at that time but relapsed again. Cardiac catheterization in 06/2012 showed moderate two-vessel disease in the mid RCA and mid left circumflex. Ejection fraction was 35%. His cardiomyopathy was out of proportion to coronary artery disease. His blood pressure was elevated during the catheterization with an LVEDP of 22 mm mercury. Heart failure medications were uptitrated gradually. He was hospitalized recently for COPD exacerbation. Echocardiogram showed actual improvement in LV systolic function with an ejection fraction of 50-55% . He was seen by Eula Listenyan Dunn. Labs showed mild hyperkalemia and thus ACE inhibitor was discontinued. Overall he has been doing reasonably well and continues to improve.     Allergies  Allergen Reactions  . Androgel [Testosterone] Shortness Of Breath    Other reaction(s): Localized superficial swelling of skin swelling     Current Outpatient Prescriptions on File Prior to Visit  Medication Sig Dispense Refill  . albuterol (PROVENTIL HFA;VENTOLIN HFA) 108 (90 BASE) MCG/ACT inhaler Inhale 2 puffs into the lungs every 4 (four) hours as needed for wheezing.      Marland Kitchen. aspirin 81 MG tablet Take 1 tablet (81 mg total) by mouth daily.  30 tablet  0  . eplerenone (INSPRA) 25 MG tablet Take 1 tablet (25 mg total) by mouth daily.  30 tablet  3  . furosemide (LASIX)  40 MG tablet Take 40 mg by mouth daily.       Marland Kitchen. glimepiride (AMARYL) 4 MG tablet Take 4 mg by mouth daily before breakfast.      . ipratropium-albuterol (DUONEB) 0.5-2.5 (3) MG/3ML SOLN Take 3 mLs by nebulization every 4 (four) hours as needed.      . metoprolol succinate (TOPROL-XL) 50 MG 24 hr tablet take 1 tablet by mouth once daily TAKE WITH OR IMMEDIATELY FOLLOWING A MEAL  30 tablet  3  . NON FORMULARY Oxygen 2 liters daily      . pramipexole (MIRAPEX) 1 MG tablet Take 1 mg by mouth daily.      Marland Kitchen. tiotropium (SPIRIVA) 18 MCG inhalation capsule Place 18 mcg into inhaler and inhale daily.       No current facility-administered medications on file prior to visit.     Past Medical History  Diagnosis Date  . DM2 (diabetes mellitus, type 2)   . COPD (chronic obstructive pulmonary disease)   . OSA (obstructive sleep apnea)     a. BiPAP nightly  . Morbid obesity   . Hypertension   . Chronic systolic heart failure     a. echo 05/12/12: EF of 20-25%, mod-severely increased LV internal cavity size, mod dilated LA, mildly dialted RA, mild-mod MR, mild Ao sclerosis; b. echo 10/23/13: EF 55-60%, mild concentric LVH, mildly dilated LA/RA, mild-mod Ao sclerosis w/o stenosis  . Coronary artery disease     a. cardiac cath: 05/2012: EF 35%, no evidence of obstructive disease w/ mLCx 60% and mRCA 60%  . Chronic respiratory failure  a. on 4L O2 at home  . Tobacco abuse   . Pneumonia      Past Surgical History  Procedure Laterality Date  . Cataract extraction      right eye  . Hernia repair    . Cardiac catheterization  06/20/12    [facility];EF 35%     Family History  Problem Relation Age of Onset  . Hypertension Father   . Heart disease Father   . Hyperlipidemia Father      History   Social History  . Marital Status: Married    Spouse Name: N/A    Number of Children: N/A  . Years of Education: N/A   Occupational History  . Not on file.   Social History Main Topics  . Smoking  status: Current Every Day Smoker -- 0.50 packs/day for 40 years    Types: Cigarettes  . Smokeless tobacco: Former NeurosurgeonUser    Quit date: 05/12/2012  . Alcohol Use: No  . Drug Use: No  . Sexual Activity: Not on file   Other Topics Concern  . Not on file   Social History Narrative  . No narrative on file        PHYSICAL EXAM   BP 149/90  Pulse 89  Ht 6' (1.829 m)  Wt 316 lb 8 oz (143.563 kg)  BMI 42.92 kg/m2  SpO2 91% Constitutional: He is oriented to person, place, and time. He appears well-developed and well-nourished. No distress.  HENT: No nasal discharge.  Head: Normocephalic and atraumatic.  Eyes: Pupils are equal and round. Right eye exhibits no discharge. Left eye exhibits no discharge.  Neck: Normal range of motion. Neck supple. No JVD present. No thyromegaly present.  Cardiovascular: Normal rate, regular rhythm, normal heart sounds and. Exam reveals no gallop and no friction rub. No murmur heard.  Pulmonary/Chest: Effort normal and breath sounds normal. No stridor. No respiratory distress. He has no wheezes. He has no rales. He exhibits no tenderness.  Abdominal: Soft. Bowel sounds are normal. He exhibits no distension. There is no tenderness. There is no rebound and no guarding.  Musculoskeletal: Normal range of motion. He exhibits trace edema and no tenderness.  Neurological: He is alert and oriented to person, place, and time. Coordination normal.  Skin: Skin is warm and dry. Chronic stasis dermatitis in the lower extremities. He is not diaphoretic. No erythema. No pallor.  Psychiatric: He has a normal mood and affect. His behavior is normal. Judgment and thought content normal.      ASSESSMENT AND PLAN

## 2013-11-14 ENCOUNTER — Telehealth: Payer: Self-pay | Admitting: *Deleted

## 2013-11-14 ENCOUNTER — Encounter: Payer: Self-pay | Admitting: Cardiovascular Disease

## 2013-11-14 LAB — BASIC METABOLIC PANEL
BUN/Creatinine Ratio: 19 (ref 10–22)
BUN: 13 mg/dL (ref 8–27)
CALCIUM: 9.9 mg/dL (ref 8.6–10.2)
CO2: 30 mmol/L — AB (ref 18–29)
CREATININE: 0.69 mg/dL — AB (ref 0.76–1.27)
Chloride: 93 mmol/L — ABNORMAL LOW (ref 97–108)
GFR calc Af Amer: 117 mL/min/{1.73_m2} (ref >59)
GFR calc non Af Amer: 101 mL/min/{1.73_m2} (ref >59)
GLUCOSE: 109 mg/dL — AB (ref 65–99)
Potassium: 4.6 mmol/L (ref 3.5–5.2)
Sodium: 136 mmol/L (ref 134–144)

## 2013-11-14 NOTE — Telephone Encounter (Signed)
error 

## 2013-11-14 NOTE — Assessment & Plan Note (Signed)
He has no symptoms of angina. Continue medical therapy. 

## 2013-11-14 NOTE — Assessment & Plan Note (Signed)
Blood pressure is mildly elevated. Lisinopril was resumed as outlined above.

## 2013-11-14 NOTE — Assessment & Plan Note (Signed)
He appears to be euvolemic. Most recent ejection fraction was 50-55%. Given that his ejection fraction is relatively close to normal and he had issues with hyperlipidemia, I favor that he is on an ACE inhibitor over  aldosterone blocker. Thus, I discontinued Eplerenone and resumed lisinopril 40 mg once daily

## 2013-11-21 DIAGNOSIS — I2510 Atherosclerotic heart disease of native coronary artery without angina pectoris: Secondary | ICD-10-CM

## 2013-11-21 DIAGNOSIS — J9611 Chronic respiratory failure with hypoxia: Secondary | ICD-10-CM

## 2013-11-21 DIAGNOSIS — R0602 Shortness of breath: Secondary | ICD-10-CM

## 2013-11-21 DIAGNOSIS — I5022 Chronic systolic (congestive) heart failure: Secondary | ICD-10-CM

## 2013-12-12 DIAGNOSIS — G4733 Obstructive sleep apnea (adult) (pediatric): Secondary | ICD-10-CM | POA: Insufficient documentation

## 2013-12-12 DIAGNOSIS — J449 Chronic obstructive pulmonary disease, unspecified: Secondary | ICD-10-CM | POA: Insufficient documentation

## 2013-12-12 DIAGNOSIS — E119 Type 2 diabetes mellitus without complications: Secondary | ICD-10-CM | POA: Insufficient documentation

## 2013-12-12 DIAGNOSIS — J9610 Chronic respiratory failure, unspecified whether with hypoxia or hypercapnia: Secondary | ICD-10-CM | POA: Insufficient documentation

## 2013-12-12 DIAGNOSIS — Z720 Tobacco use: Secondary | ICD-10-CM | POA: Insufficient documentation

## 2013-12-30 ENCOUNTER — Ambulatory Visit: Payer: Self-pay | Admitting: Internal Medicine

## 2014-01-01 ENCOUNTER — Inpatient Hospital Stay: Payer: Self-pay | Admitting: Internal Medicine

## 2014-01-01 LAB — COMPREHENSIVE METABOLIC PANEL
ALBUMIN: 3.6 g/dL (ref 3.4–5.0)
ALK PHOS: 96 U/L
ALT: 41 U/L
ANION GAP: 5 — AB (ref 7–16)
BILIRUBIN TOTAL: 1.1 mg/dL — AB (ref 0.2–1.0)
BUN: 18 mg/dL (ref 7–18)
CALCIUM: 9.4 mg/dL (ref 8.5–10.1)
CO2: 34 mmol/L — AB (ref 21–32)
Chloride: 94 mmol/L — ABNORMAL LOW (ref 98–107)
Creatinine: 0.7 mg/dL (ref 0.60–1.30)
Glucose: 109 mg/dL — ABNORMAL HIGH (ref 65–99)
Osmolality: 269 (ref 275–301)
POTASSIUM: 4.8 mmol/L (ref 3.5–5.1)
SGOT(AST): 57 U/L — ABNORMAL HIGH (ref 15–37)
SODIUM: 133 mmol/L — AB (ref 136–145)
TOTAL PROTEIN: 7.8 g/dL (ref 6.4–8.2)

## 2014-01-01 LAB — URINALYSIS, COMPLETE
BACTERIA: NONE SEEN
BLOOD: NEGATIVE
Bilirubin,UR: NEGATIVE
GLUCOSE, UR: NEGATIVE mg/dL (ref 0–75)
Ketone: NEGATIVE
LEUKOCYTE ESTERASE: NEGATIVE
NITRITE: NEGATIVE
Ph: 8 (ref 4.5–8.0)
Protein: 100
RBC,UR: 2 /HPF (ref 0–5)
Specific Gravity: 1.015 (ref 1.003–1.030)
Squamous Epithelial: <1
WBC UR: 1 /HPF (ref 0–5)

## 2014-01-01 LAB — CBC WITH DIFFERENTIAL/PLATELET
Basophil #: 0.1 10*3/uL (ref 0.0–0.1)
Basophil %: 0.9 %
EOS PCT: 0.2 %
Eosinophil #: 0 10*3/uL (ref 0.0–0.7)
HCT: 56.5 % — AB (ref 40.0–52.0)
HGB: 18.3 g/dL — ABNORMAL HIGH (ref 13.0–18.0)
LYMPHS PCT: 1.7 %
Lymphocyte #: 0.2 10*3/uL — ABNORMAL LOW (ref 1.0–3.6)
MCH: 31.9 pg (ref 26.0–34.0)
MCHC: 32.3 g/dL (ref 32.0–36.0)
MCV: 99 fL (ref 80–100)
MONOS PCT: 3.4 %
Monocyte #: 0.5 x10 3/mm (ref 0.2–1.0)
Neutrophil #: 12.9 10*3/uL — ABNORMAL HIGH (ref 1.4–6.5)
Neutrophil %: 93.8 %
Platelet: 127 10*3/uL — ABNORMAL LOW (ref 150–440)
RBC: 5.72 10*6/uL (ref 4.40–5.90)
RDW: 15.2 % — AB (ref 11.5–14.5)
WBC: 13.8 10*3/uL — ABNORMAL HIGH (ref 3.8–10.6)

## 2014-01-01 LAB — PROTIME-INR
INR: 1.1
PROTHROMBIN TIME: 13.8 s (ref 11.5–14.7)

## 2014-01-01 LAB — MAGNESIUM: Magnesium: 1.3 mg/dL — ABNORMAL LOW

## 2014-01-01 LAB — TROPONIN I: Troponin-I: 0.03 ng/mL

## 2014-01-01 LAB — PHOSPHORUS: Phosphorus: 2.2 mg/dL — ABNORMAL LOW (ref 2.5–4.9)

## 2014-01-02 LAB — BASIC METABOLIC PANEL
Anion Gap: 5 — ABNORMAL LOW (ref 7–16)
BUN: 25 mg/dL — ABNORMAL HIGH (ref 7–18)
CO2: 35 mmol/L — AB (ref 21–32)
CREATININE: 1.12 mg/dL (ref 0.60–1.30)
Calcium, Total: 9.3 mg/dL (ref 8.5–10.1)
Chloride: 93 mmol/L — ABNORMAL LOW (ref 98–107)
EGFR (African American): >60
EGFR (Non-African Amer.): >60
Glucose: 144 mg/dL — ABNORMAL HIGH (ref 65–99)
Osmolality: 273 (ref 275–301)
POTASSIUM: 4.3 mmol/L (ref 3.5–5.1)
Sodium: 133 mmol/L — ABNORMAL LOW (ref 136–145)

## 2014-01-02 LAB — CBC WITH DIFFERENTIAL/PLATELET
Basophil #: 0 10*3/uL (ref 0.0–0.1)
Basophil %: 0.4 %
EOS ABS: 0.1 10*3/uL (ref 0.0–0.7)
Eosinophil %: 0.5 %
HCT: 50.5 % (ref 40.0–52.0)
HGB: 15.8 g/dL (ref 13.0–18.0)
LYMPHS PCT: 3.3 %
Lymphocyte #: 0.4 10*3/uL — ABNORMAL LOW (ref 1.0–3.6)
MCH: 31.8 pg (ref 26.0–34.0)
MCHC: 31.4 g/dL — AB (ref 32.0–36.0)
MCV: 101 fL — ABNORMAL HIGH (ref 80–100)
Monocyte #: 0.7 x10 3/mm (ref 0.2–1.0)
Monocyte %: 5.6 %
NEUTROS ABS: 10.7 10*3/uL — AB (ref 1.4–6.5)
Neutrophil %: 90.2 %
Platelet: 108 10*3/uL — ABNORMAL LOW (ref 150–440)
RBC: 4.98 10*6/uL (ref 4.40–5.90)
RDW: 15.3 % — AB (ref 11.5–14.5)
WBC: 11.9 10*3/uL — AB (ref 3.8–10.6)

## 2014-01-02 LAB — VANCOMYCIN, TROUGH: Vancomycin, Trough: 15 ug/mL (ref 10–20)

## 2014-01-02 LAB — URINE CULTURE

## 2014-01-02 LAB — PHOSPHORUS: Phosphorus: 3.7 mg/dL (ref 2.5–4.9)

## 2014-01-02 LAB — MAGNESIUM: Magnesium: 1.9 mg/dL

## 2014-01-03 LAB — BASIC METABOLIC PANEL
Anion Gap: 6 — ABNORMAL LOW (ref 7–16)
BUN: 30 mg/dL — ABNORMAL HIGH (ref 7–18)
CALCIUM: 9.5 mg/dL (ref 8.5–10.1)
CREATININE: 1.14 mg/dL (ref 0.60–1.30)
Chloride: 90 mmol/L — ABNORMAL LOW (ref 98–107)
Co2: 37 mmol/L — ABNORMAL HIGH (ref 21–32)
Glucose: 143 mg/dL — ABNORMAL HIGH (ref 65–99)
OSMOLALITY: 275 (ref 275–301)
Potassium: 4.7 mmol/L (ref 3.5–5.1)
SODIUM: 133 mmol/L — AB (ref 136–145)

## 2014-01-03 LAB — WBC: WBC: 10.2 10*3/uL (ref 3.8–10.6)

## 2014-01-04 LAB — CBC WITH DIFFERENTIAL/PLATELET
Basophil #: 0 10*3/uL (ref 0.0–0.1)
Basophil %: 0.5 %
EOS PCT: 1.2 %
Eosinophil #: 0.1 10*3/uL (ref 0.0–0.7)
HCT: 48.2 % (ref 40.0–52.0)
HGB: 15.4 g/dL (ref 13.0–18.0)
LYMPHS PCT: 11 %
Lymphocyte #: 0.9 10*3/uL — ABNORMAL LOW (ref 1.0–3.6)
MCH: 31.7 pg (ref 26.0–34.0)
MCHC: 31.9 g/dL — ABNORMAL LOW (ref 32.0–36.0)
MCV: 100 fL (ref 80–100)
MONO ABS: 0.8 x10 3/mm (ref 0.2–1.0)
Monocyte %: 9 %
NEUTROS PCT: 78.3 %
Neutrophil #: 6.7 10*3/uL — ABNORMAL HIGH (ref 1.4–6.5)
Platelet: 126 10*3/uL — ABNORMAL LOW (ref 150–440)
RBC: 4.84 10*6/uL (ref 4.40–5.90)
RDW: 15.1 % — AB (ref 11.5–14.5)
WBC: 8.5 10*3/uL (ref 3.8–10.6)

## 2014-01-04 LAB — BASIC METABOLIC PANEL
Anion Gap: 5 — ABNORMAL LOW (ref 7–16)
BUN: 27 mg/dL — ABNORMAL HIGH (ref 7–18)
CALCIUM: 9.4 mg/dL (ref 8.5–10.1)
CO2: 36 mmol/L — AB (ref 21–32)
CREATININE: 1.18 mg/dL (ref 0.60–1.30)
Chloride: 91 mmol/L — ABNORMAL LOW (ref 98–107)
EGFR (African American): >60
EGFR (Non-African Amer.): >60
Glucose: 181 mg/dL — ABNORMAL HIGH (ref 65–99)
Osmolality: 274 (ref 275–301)
POTASSIUM: 4.4 mmol/L (ref 3.5–5.1)
SODIUM: 132 mmol/L — AB (ref 136–145)

## 2014-01-05 LAB — BASIC METABOLIC PANEL
Anion Gap: 2 — ABNORMAL LOW (ref 7–16)
BUN: 23 mg/dL — AB (ref 7–18)
Calcium, Total: 9.4 mg/dL (ref 8.5–10.1)
Chloride: 91 mmol/L — ABNORMAL LOW (ref 98–107)
Co2: 38 mmol/L — ABNORMAL HIGH (ref 21–32)
Creatinine: 1.16 mg/dL (ref 0.60–1.30)
EGFR (Non-African Amer.): >60
Glucose: 146 mg/dL — ABNORMAL HIGH (ref 65–99)
OSMOLALITY: 269 (ref 275–301)
POTASSIUM: 4.5 mmol/L (ref 3.5–5.1)
Sodium: 131 mmol/L — ABNORMAL LOW (ref 136–145)

## 2014-01-05 LAB — CBC WITH DIFFERENTIAL/PLATELET
BASOS PCT: 0.8 %
Basophil #: 0.1 10*3/uL (ref 0.0–0.1)
EOS ABS: 0.1 10*3/uL (ref 0.0–0.7)
Eosinophil %: 1.8 %
HCT: 48.4 % (ref 40.0–52.0)
HGB: 15.1 g/dL (ref 13.0–18.0)
LYMPHS ABS: 0.9 10*3/uL — AB (ref 1.0–3.6)
Lymphocyte %: 11.9 %
MCH: 31.4 pg (ref 26.0–34.0)
MCHC: 31.3 g/dL — AB (ref 32.0–36.0)
MCV: 100 fL (ref 80–100)
MONOS PCT: 12.2 %
Monocyte #: 0.9 x10 3/mm (ref 0.2–1.0)
Neutrophil #: 5.6 10*3/uL (ref 1.4–6.5)
Neutrophil %: 73.3 %
PLATELETS: 130 10*3/uL — AB (ref 150–440)
RBC: 4.82 10*6/uL (ref 4.40–5.90)
RDW: 15.3 % — AB (ref 11.5–14.5)
WBC: 7.7 10*3/uL (ref 3.8–10.6)

## 2014-01-05 LAB — VANCOMYCIN, TROUGH

## 2014-01-06 LAB — CBC WITH DIFFERENTIAL/PLATELET
BASOS PCT: 0.5 %
Basophil #: 0.1 10*3/uL (ref 0.0–0.1)
EOS ABS: 0.2 10*3/uL (ref 0.0–0.7)
Eosinophil %: 2.2 %
HCT: 47.3 % (ref 40.0–52.0)
HGB: 15.4 g/dL (ref 13.0–18.0)
LYMPHS ABS: 0.8 10*3/uL — AB (ref 1.0–3.6)
LYMPHS PCT: 8.3 %
MCH: 31.9 pg (ref 26.0–34.0)
MCHC: 32.5 g/dL (ref 32.0–36.0)
MCV: 98 fL (ref 80–100)
MONO ABS: 1.2 x10 3/mm — AB (ref 0.2–1.0)
Monocyte %: 12 %
Neutrophil #: 7.7 10*3/uL — ABNORMAL HIGH (ref 1.4–6.5)
Neutrophil %: 77 %
PLATELETS: 142 10*3/uL — AB (ref 150–440)
RBC: 4.82 10*6/uL (ref 4.40–5.90)
RDW: 15.1 % — ABNORMAL HIGH (ref 11.5–14.5)
WBC: 10 10*3/uL (ref 3.8–10.6)

## 2014-01-06 LAB — BASIC METABOLIC PANEL
ANION GAP: 3 — AB (ref 7–16)
BUN: 27 mg/dL — ABNORMAL HIGH (ref 7–18)
CALCIUM: 9.4 mg/dL (ref 8.5–10.1)
Chloride: 90 mmol/L — ABNORMAL LOW (ref 98–107)
Co2: 38 mmol/L — ABNORMAL HIGH (ref 21–32)
Creatinine: 1.13 mg/dL (ref 0.60–1.30)
EGFR (African American): >60
EGFR (Non-African Amer.): >60
Glucose: 188 mg/dL — ABNORMAL HIGH (ref 65–99)
Osmolality: 273 (ref 275–301)
Potassium: 4.3 mmol/L (ref 3.5–5.1)
SODIUM: 131 mmol/L — AB (ref 136–145)

## 2014-01-06 LAB — MAGNESIUM
Magnesium: 1.4 mg/dL — ABNORMAL LOW
Magnesium: 1.7 mg/dL — ABNORMAL LOW

## 2014-01-06 LAB — CULTURE, BLOOD (SINGLE)

## 2014-01-06 LAB — PHOSPHORUS: PHOSPHORUS: 2.9 mg/dL (ref 2.5–4.9)

## 2014-01-07 DIAGNOSIS — I517 Cardiomegaly: Secondary | ICD-10-CM

## 2014-01-07 LAB — CBC WITH DIFFERENTIAL/PLATELET
Basophil #: 0 10*3/uL (ref 0.0–0.1)
Basophil %: 0.1 %
EOS PCT: 0 %
Eosinophil #: 0 10*3/uL (ref 0.0–0.7)
HCT: 49.4 % (ref 40.0–52.0)
HGB: 15.6 g/dL (ref 13.0–18.0)
Lymphocyte #: 0.3 10*3/uL — ABNORMAL LOW (ref 1.0–3.6)
Lymphocyte %: 3.4 %
MCH: 31.7 pg (ref 26.0–34.0)
MCHC: 31.5 g/dL — ABNORMAL LOW (ref 32.0–36.0)
MCV: 101 fL — AB (ref 80–100)
Monocyte #: 0.7 x10 3/mm (ref 0.2–1.0)
Monocyte %: 7.1 %
NEUTROS ABS: 8.6 10*3/uL — AB (ref 1.4–6.5)
NEUTROS PCT: 89.4 %
Platelet: 142 10*3/uL — ABNORMAL LOW (ref 150–440)
RBC: 4.91 10*6/uL (ref 4.40–5.90)
RDW: 15 % — ABNORMAL HIGH (ref 11.5–14.5)
WBC: 9.6 10*3/uL (ref 3.8–10.6)

## 2014-01-07 LAB — BASIC METABOLIC PANEL
Anion Gap: 3 — ABNORMAL LOW (ref 7–16)
BUN: 29 mg/dL — ABNORMAL HIGH (ref 7–18)
CHLORIDE: 89 mmol/L — AB (ref 98–107)
Calcium, Total: 9.8 mg/dL (ref 8.5–10.1)
Co2: 39 mmol/L — ABNORMAL HIGH (ref 21–32)
Creatinine: 1.11 mg/dL (ref 0.60–1.30)
EGFR (African American): >60
EGFR (Non-African Amer.): >60
Glucose: 273 mg/dL — ABNORMAL HIGH (ref 65–99)
OSMOLALITY: 278 (ref 275–301)
POTASSIUM: 4.9 mmol/L (ref 3.5–5.1)
SODIUM: 131 mmol/L — AB (ref 136–145)

## 2014-01-07 LAB — MAGNESIUM: Magnesium: 1.9 mg/dL

## 2014-01-07 LAB — PHOSPHORUS: PHOSPHORUS: 2.5 mg/dL (ref 2.5–4.9)

## 2014-01-08 LAB — BASIC METABOLIC PANEL
Anion Gap: 3 — ABNORMAL LOW (ref 7–16)
BUN: 35 mg/dL — ABNORMAL HIGH (ref 7–18)
CALCIUM: 9.9 mg/dL (ref 8.5–10.1)
CHLORIDE: 91 mmol/L — AB (ref 98–107)
Co2: 38 mmol/L — ABNORMAL HIGH (ref 21–32)
Creatinine: 1.12 mg/dL (ref 0.60–1.30)
EGFR (African American): >60
Glucose: 301 mg/dL — ABNORMAL HIGH (ref 65–99)
Osmolality: 284 (ref 275–301)
Potassium: 4.7 mmol/L (ref 3.5–5.1)
SODIUM: 132 mmol/L — AB (ref 136–145)

## 2014-01-09 LAB — BASIC METABOLIC PANEL
Anion Gap: 0 — ABNORMAL LOW (ref 7–16)
BUN: 45 mg/dL — ABNORMAL HIGH (ref 7–18)
CHLORIDE: 92 mmol/L — AB (ref 98–107)
CO2: 42 mmol/L — AB (ref 21–32)
CREATININE: 1.02 mg/dL (ref 0.60–1.30)
Calcium, Total: 10.4 mg/dL — ABNORMAL HIGH (ref 8.5–10.1)
EGFR (Non-African Amer.): >60
GLUCOSE: 153 mg/dL — AB (ref 65–99)
Osmolality: 283 (ref 275–301)
POTASSIUM: 4.9 mmol/L (ref 3.5–5.1)
Sodium: 134 mmol/L — ABNORMAL LOW (ref 136–145)

## 2014-01-09 LAB — PHOSPHORUS: PHOSPHORUS: 2.6 mg/dL (ref 2.5–4.9)

## 2014-01-09 LAB — MAGNESIUM: Magnesium: 1.9 mg/dL

## 2014-01-10 LAB — PHOSPHORUS: Phosphorus: 2.8 mg/dL (ref 2.5–4.9)

## 2014-01-10 LAB — BASIC METABOLIC PANEL
Anion Gap: 3 — ABNORMAL LOW (ref 7–16)
BUN: 50 mg/dL — AB (ref 7–18)
CALCIUM: 10.2 mg/dL — AB (ref 8.5–10.1)
CHLORIDE: 93 mmol/L — AB (ref 98–107)
CO2: 40 mmol/L — AB (ref 21–32)
Creatinine: 1.09 mg/dL (ref 0.60–1.30)
EGFR (African American): >60
EGFR (Non-African Amer.): >60
GLUCOSE: 147 mg/dL — AB (ref 65–99)
Osmolality: 288 (ref 275–301)
POTASSIUM: 4.6 mmol/L (ref 3.5–5.1)
Sodium: 136 mmol/L (ref 136–145)

## 2014-01-10 LAB — CBC WITH DIFFERENTIAL/PLATELET
BASOS PCT: 0.1 %
Basophil #: 0 10*3/uL (ref 0.0–0.1)
EOS ABS: 0 10*3/uL (ref 0.0–0.7)
Eosinophil %: 0.1 %
HCT: 49.2 % (ref 40.0–52.0)
HGB: 15.2 g/dL (ref 13.0–18.0)
LYMPHS ABS: 0.9 10*3/uL — AB (ref 1.0–3.6)
Lymphocyte %: 6.8 %
MCH: 31 pg (ref 26.0–34.0)
MCHC: 31 g/dL — AB (ref 32.0–36.0)
MCV: 100 fL (ref 80–100)
Monocyte #: 1.3 x10 3/mm — ABNORMAL HIGH (ref 0.2–1.0)
Monocyte %: 9.3 %
Neutrophil #: 11.3 10*3/uL — ABNORMAL HIGH (ref 1.4–6.5)
Neutrophil %: 83.7 %
Platelet: 221 10*3/uL (ref 150–440)
RBC: 4.91 10*6/uL (ref 4.40–5.90)
RDW: 15.4 % — ABNORMAL HIGH (ref 11.5–14.5)
WBC: 13.5 10*3/uL — ABNORMAL HIGH (ref 3.8–10.6)

## 2014-01-10 LAB — MAGNESIUM: Magnesium: 2 mg/dL

## 2014-01-11 LAB — CBC WITH DIFFERENTIAL/PLATELET
Basophil #: 0 10*3/uL (ref 0.0–0.1)
Basophil %: 0.3 %
EOS PCT: 0.1 %
Eosinophil #: 0 10*3/uL (ref 0.0–0.7)
HCT: 48.5 % (ref 40.0–52.0)
HGB: 15.1 g/dL (ref 13.0–18.0)
Lymphocyte #: 1 10*3/uL (ref 1.0–3.6)
Lymphocyte %: 6.1 %
MCH: 31 pg (ref 26.0–34.0)
MCHC: 31.1 g/dL — ABNORMAL LOW (ref 32.0–36.0)
MCV: 100 fL (ref 80–100)
Monocyte #: 1.1 x10 3/mm — ABNORMAL HIGH (ref 0.2–1.0)
Monocyte %: 6.5 %
Neutrophil #: 14.8 10*3/uL — ABNORMAL HIGH (ref 1.4–6.5)
Neutrophil %: 87 %
PLATELETS: 214 10*3/uL (ref 150–440)
RBC: 4.86 10*6/uL (ref 4.40–5.90)
RDW: 15.5 % — ABNORMAL HIGH (ref 11.5–14.5)
WBC: 17 10*3/uL — AB (ref 3.8–10.6)

## 2014-01-11 LAB — BASIC METABOLIC PANEL
Anion Gap: 3 — ABNORMAL LOW (ref 7–16)
BUN: 63 mg/dL — ABNORMAL HIGH (ref 7–18)
CREATININE: 1.07 mg/dL (ref 0.60–1.30)
Calcium, Total: 10.7 mg/dL — ABNORMAL HIGH (ref 8.5–10.1)
Chloride: 96 mmol/L — ABNORMAL LOW (ref 98–107)
Co2: 38 mmol/L — ABNORMAL HIGH (ref 21–32)
EGFR (African American): >60
GLUCOSE: 152 mg/dL — AB (ref 65–99)
OSMOLALITY: 295 (ref 275–301)
POTASSIUM: 4.9 mmol/L (ref 3.5–5.1)
Sodium: 137 mmol/L (ref 136–145)

## 2014-01-11 LAB — EXPECTORATED SPUTUM ASSESSMENT W REFEX TO RESP CULTURE

## 2014-01-12 LAB — BASIC METABOLIC PANEL
BUN: 72 mg/dL — ABNORMAL HIGH (ref 7–18)
CO2: 41 mmol/L — AB (ref 21–32)
Calcium, Total: 10.7 mg/dL — ABNORMAL HIGH (ref 8.5–10.1)
Chloride: 97 mmol/L — ABNORMAL LOW (ref 98–107)
Creatinine: 1.09 mg/dL (ref 0.60–1.30)
Glucose: 145 mg/dL — ABNORMAL HIGH (ref 65–99)
Osmolality: 298 (ref 275–301)
POTASSIUM: 5.3 mmol/L — AB (ref 3.5–5.1)
Sodium: 137 mmol/L (ref 136–145)

## 2014-01-12 LAB — MAGNESIUM: MAGNESIUM: 2.3 mg/dL

## 2014-01-12 LAB — PHOSPHORUS: PHOSPHORUS: 3.3 mg/dL (ref 2.5–4.9)

## 2014-01-13 LAB — CBC WITH DIFFERENTIAL/PLATELET
Basophil #: 0 10*3/uL (ref 0.0–0.1)
Basophil %: 0.3 %
Eosinophil #: 0 10*3/uL (ref 0.0–0.7)
Eosinophil %: 0 %
HCT: 49.4 % (ref 40.0–52.0)
HGB: 15.3 g/dL (ref 13.0–18.0)
LYMPHS ABS: 1 10*3/uL (ref 1.0–3.6)
LYMPHS PCT: 6.7 %
MCH: 30.9 pg (ref 26.0–34.0)
MCHC: 31 g/dL — ABNORMAL LOW (ref 32.0–36.0)
MCV: 100 fL (ref 80–100)
MONO ABS: 1.2 x10 3/mm — AB (ref 0.2–1.0)
Monocyte %: 7.9 %
NEUTROS ABS: 12.6 10*3/uL — AB (ref 1.4–6.5)
Neutrophil %: 85.1 %
PLATELETS: 246 10*3/uL (ref 150–440)
RBC: 4.97 10*6/uL (ref 4.40–5.90)
RDW: 15.3 % — ABNORMAL HIGH (ref 11.5–14.5)
WBC: 14.9 10*3/uL — ABNORMAL HIGH (ref 3.8–10.6)

## 2014-01-13 LAB — BASIC METABOLIC PANEL
ANION GAP: 2 — AB (ref 7–16)
BUN: 77 mg/dL — ABNORMAL HIGH (ref 7–18)
Calcium, Total: 11.2 mg/dL — ABNORMAL HIGH (ref 8.5–10.1)
Chloride: 98 mmol/L (ref 98–107)
Co2: 39 mmol/L — ABNORMAL HIGH (ref 21–32)
Creatinine: 1.21 mg/dL (ref 0.60–1.30)
EGFR (African American): >60
GLUCOSE: 124 mg/dL — AB (ref 65–99)
OSMOLALITY: 302 (ref 275–301)
Potassium: 5.5 mmol/L — ABNORMAL HIGH (ref 3.5–5.1)
Sodium: 139 mmol/L (ref 136–145)

## 2014-01-13 LAB — ALBUMIN: Albumin: 2.3 g/dL — ABNORMAL LOW (ref 3.4–5.0)

## 2014-01-13 LAB — TRIGLYCERIDES: TRIGLYCERIDES: 85 mg/dL (ref 0–200)

## 2014-01-13 LAB — MAGNESIUM: Magnesium: 2.3 mg/dL

## 2014-01-13 LAB — PHOSPHORUS: Phosphorus: 2.1 mg/dL — ABNORMAL LOW (ref 2.5–4.9)

## 2014-01-14 LAB — BASIC METABOLIC PANEL
ANION GAP: 0 — AB (ref 7–16)
ANION GAP: 3 — AB (ref 7–16)
BUN: 71 mg/dL — ABNORMAL HIGH (ref 7–18)
BUN: 79 mg/dL — AB (ref 7–18)
Calcium, Total: 9.2 mg/dL (ref 8.5–10.1)
Calcium, Total: 10.7 mg/dL — ABNORMAL HIGH (ref 8.5–10.1)
Chloride: 98 mmol/L (ref 98–107)
Chloride: 101 mmol/L (ref 98–107)
Co2: 35 mmol/L — ABNORMAL HIGH (ref 21–32)
Co2: 35 mmol/L — ABNORMAL HIGH (ref 21–32)
Creatinine: 0.8 mg/dL (ref 0.60–1.30)
Creatinine: 1.25 mg/dL (ref 0.60–1.30)
EGFR (Non-African Amer.): >60
Glucose: 164 mg/dL — ABNORMAL HIGH (ref 65–99)
Glucose: 192 mg/dL — ABNORMAL HIGH (ref 65–99)
Osmolality: 291 (ref 275–301)
Osmolality: 306 (ref 275–301)
Potassium: 6 mmol/L — ABNORMAL HIGH (ref 3.5–5.1)
Potassium: 5.8 mmol/L — ABNORMAL HIGH (ref 3.5–5.1)
SODIUM: 133 mmol/L — AB (ref 136–145)
Sodium: 139 mmol/L (ref 136–145)

## 2014-01-14 LAB — URINALYSIS, COMPLETE
Bacteria: NONE SEEN
Bilirubin,UR: NEGATIVE
GLUCOSE, UR: NEGATIVE mg/dL (ref 0–75)
KETONE: NEGATIVE
NITRITE: NEGATIVE
PROTEIN: NEGATIVE
Ph: 6 (ref 4.5–8.0)
Specific Gravity: 1.016 (ref 1.003–1.030)
WBC UR: 10 /HPF (ref 0–5)

## 2014-01-14 LAB — PHOSPHORUS: Phosphorus: 1.7 mg/dL — ABNORMAL LOW (ref 2.5–4.9)

## 2014-01-14 LAB — CK: CK, Total: 220 U/L (ref 39–308)

## 2014-01-14 LAB — MAGNESIUM: MAGNESIUM: 1.8 mg/dL

## 2014-01-15 LAB — BASIC METABOLIC PANEL
Anion Gap: 2 — ABNORMAL LOW (ref 7–16)
BUN: 89 mg/dL — AB (ref 7–18)
CO2: 32 mmol/L (ref 21–32)
Calcium, Total: 9.9 mg/dL (ref 8.5–10.1)
Chloride: 103 mmol/L (ref 98–107)
Creatinine: 1.27 mg/dL (ref 0.60–1.30)
EGFR (Non-African Amer.): >60
GLUCOSE: 151 mg/dL — AB (ref 65–99)
Osmolality: 304 (ref 275–301)
POTASSIUM: 5 mmol/L (ref 3.5–5.1)
Sodium: 137 mmol/L (ref 136–145)

## 2014-01-15 LAB — CLOSTRIDIUM DIFFICILE(ARMC)

## 2014-01-15 LAB — MAGNESIUM: MAGNESIUM: 1.8 mg/dL

## 2014-01-15 LAB — PHOSPHORUS: PHOSPHORUS: 2.3 mg/dL — AB (ref 2.5–4.9)

## 2014-01-15 LAB — PROTIME-INR
INR: 1.2
PROTHROMBIN TIME: 15.2 s — AB (ref 11.5–14.7)

## 2014-01-15 LAB — APTT: ACTIVATED PTT: 27.1 s (ref 23.6–35.9)

## 2014-01-15 LAB — HEPARIN LEVEL (UNFRACTIONATED): Anti-Xa(Unfractionated): 0.48 IU/mL (ref 0.30–0.70)

## 2014-01-16 LAB — HEPARIN LEVEL (UNFRACTIONATED)
Anti-Xa(Unfractionated): 0.29 IU/mL — ABNORMAL LOW (ref 0.30–0.70)
Anti-Xa(Unfractionated): 0.37 IU/mL (ref 0.30–0.70)

## 2014-01-16 LAB — BASIC METABOLIC PANEL
Anion Gap: 7 (ref 7–16)
BUN: 69 mg/dL — ABNORMAL HIGH (ref 7–18)
CHLORIDE: 97 mmol/L — AB (ref 98–107)
CO2: 31 mmol/L (ref 21–32)
CREATININE: 0.92 mg/dL (ref 0.60–1.30)
Calcium, Total: 8.8 mg/dL (ref 8.5–10.1)
EGFR (African American): >60
EGFR (Non-African Amer.): >60
GLUCOSE: 273 mg/dL — AB (ref 65–99)
Osmolality: 300 (ref 275–301)
Potassium: 3.9 mmol/L (ref 3.5–5.1)
SODIUM: 135 mmol/L — AB (ref 136–145)

## 2014-01-16 LAB — URINE CULTURE

## 2014-01-16 LAB — MAGNESIUM: Magnesium: 1.7 mg/dL — ABNORMAL LOW

## 2014-01-16 LAB — PHOSPHORUS: PHOSPHORUS: 2.9 mg/dL (ref 2.5–4.9)

## 2014-01-17 LAB — CBC WITH DIFFERENTIAL/PLATELET
Basophil #: 0.1 10*3/uL (ref 0.0–0.1)
Basophil %: 0.7 %
Eosinophil #: 0.4 10*3/uL (ref 0.0–0.7)
Eosinophil %: 2.8 %
HCT: 48.9 % (ref 40.0–52.0)
HGB: 15.1 g/dL (ref 13.0–18.0)
Lymphocyte #: 0.9 10*3/uL — ABNORMAL LOW (ref 1.0–3.6)
Lymphocyte %: 6.5 %
MCH: 31.2 pg (ref 26.0–34.0)
MCHC: 30.8 g/dL — AB (ref 32.0–36.0)
MCV: 101 fL — AB (ref 80–100)
MONO ABS: 1 x10 3/mm (ref 0.2–1.0)
Monocyte %: 6.9 %
Neutrophil #: 11.9 10*3/uL — ABNORMAL HIGH (ref 1.4–6.5)
Neutrophil %: 83.1 %
Platelet: 187 10*3/uL (ref 150–440)
RBC: 4.84 10*6/uL (ref 4.40–5.90)
RDW: 15.8 % — ABNORMAL HIGH (ref 11.5–14.5)
WBC: 14.4 10*3/uL — AB (ref 3.8–10.6)

## 2014-01-17 LAB — PHOSPHORUS: Phosphorus: 2.4 mg/dL — ABNORMAL LOW (ref 2.5–4.9)

## 2014-01-17 LAB — BASIC METABOLIC PANEL
Anion Gap: 4 — ABNORMAL LOW (ref 7–16)
BUN: 53 mg/dL — ABNORMAL HIGH (ref 7–18)
CALCIUM: 10.5 mg/dL — AB (ref 8.5–10.1)
Chloride: 103 mmol/L (ref 98–107)
Co2: 34 mmol/L — ABNORMAL HIGH (ref 21–32)
Creatinine: 0.71 mg/dL (ref 0.60–1.30)
EGFR (African American): >60
GLUCOSE: 215 mg/dL — AB (ref 65–99)
OSMOLALITY: 302 (ref 275–301)
POTASSIUM: 3.8 mmol/L (ref 3.5–5.1)
Sodium: 141 mmol/L (ref 136–145)

## 2014-01-17 LAB — HEPARIN LEVEL (UNFRACTIONATED)
ANTI-XA(UNFRACTIONATED): 0.19 [IU]/mL — AB (ref 0.30–0.70)
ANTI-XA(UNFRACTIONATED): 0.3 [IU]/mL (ref 0.30–0.70)
Anti-Xa(Unfractionated): 0.29 IU/mL — ABNORMAL LOW (ref 0.30–0.70)

## 2014-01-17 LAB — MAGNESIUM: Magnesium: 1.8 mg/dL

## 2014-01-18 LAB — BASIC METABOLIC PANEL
Anion Gap: 3 — ABNORMAL LOW (ref 7–16)
BUN: 52 mg/dL — ABNORMAL HIGH (ref 7–18)
CO2: 36 mmol/L — AB (ref 21–32)
Calcium, Total: 10.2 mg/dL — ABNORMAL HIGH (ref 8.5–10.1)
Chloride: 102 mmol/L (ref 98–107)
Creatinine: 0.66 mg/dL (ref 0.60–1.30)
EGFR (African American): >60
EGFR (Non-African Amer.): >60
Glucose: 168 mg/dL — ABNORMAL HIGH (ref 65–99)
Osmolality: 299 (ref 275–301)
POTASSIUM: 4.6 mmol/L (ref 3.5–5.1)
Sodium: 141 mmol/L (ref 136–145)

## 2014-01-18 LAB — HEPARIN LEVEL (UNFRACTIONATED)
ANTI-XA(UNFRACTIONATED): 0.32 [IU]/mL (ref 0.30–0.70)
Anti-Xa(Unfractionated): <0.1 IU/mL — ABNORMAL LOW (ref 0.30–0.70)

## 2014-01-18 LAB — MAGNESIUM: Magnesium: 1.5 mg/dL — ABNORMAL LOW

## 2014-01-18 LAB — PHOSPHORUS: Phosphorus: 2.2 mg/dL — ABNORMAL LOW (ref 2.5–4.9)

## 2014-01-18 LAB — VANCOMYCIN, TROUGH: Vancomycin, Trough: 16 ug/mL (ref 10–20)

## 2014-01-18 LAB — EXPECTORATED SPUTUM ASSESSMENT W REFEX TO RESP CULTURE

## 2014-01-19 LAB — HEPARIN LEVEL (UNFRACTIONATED)
ANTI-XA(UNFRACTIONATED): 0.38 [IU]/mL (ref 0.30–0.70)
Anti-Xa(Unfractionated): 0.19 IU/mL — ABNORMAL LOW (ref 0.30–0.70)

## 2014-01-19 LAB — BASIC METABOLIC PANEL
ANION GAP: 1 — AB (ref 7–16)
BUN: 44 mg/dL — AB (ref 7–18)
Calcium, Total: 10.2 mg/dL — ABNORMAL HIGH (ref 8.5–10.1)
Chloride: 99 mmol/L (ref 98–107)
Co2: 39 mmol/L — ABNORMAL HIGH (ref 21–32)
Creatinine: 0.59 mg/dL — ABNORMAL LOW (ref 0.60–1.30)
EGFR (Non-African Amer.): >60
GLUCOSE: 182 mg/dL — AB (ref 65–99)
OSMOLALITY: 293 (ref 275–301)
Potassium: 4 mmol/L (ref 3.5–5.1)
Sodium: 139 mmol/L (ref 136–145)

## 2014-01-19 LAB — PHOSPHORUS
PHOSPHORUS: 1.6 mg/dL — AB (ref 2.5–4.9)
Phosphorus: 2.4 mg/dL — ABNORMAL LOW (ref 2.5–4.9)

## 2014-01-19 LAB — HEMOGLOBIN: HGB: 14.6 g/dL (ref 13.0–18.0)

## 2014-01-19 LAB — MAGNESIUM
MAGNESIUM: 1.7 mg/dL — AB
Magnesium: 1.4 mg/dL — ABNORMAL LOW

## 2014-01-19 LAB — CULTURE, BLOOD (SINGLE)

## 2014-01-19 LAB — PLATELET COUNT: PLATELETS: 200 10*3/uL (ref 150–440)

## 2014-01-20 LAB — BASIC METABOLIC PANEL
ANION GAP: 3 — AB (ref 7–16)
BUN: 43 mg/dL — ABNORMAL HIGH (ref 7–18)
CALCIUM: 10.7 mg/dL — AB (ref 8.5–10.1)
CHLORIDE: 97 mmol/L — AB (ref 98–107)
CREATININE: 0.66 mg/dL (ref 0.60–1.30)
Co2: 36 mmol/L — ABNORMAL HIGH (ref 21–32)
EGFR (African American): >60
Glucose: 201 mg/dL — ABNORMAL HIGH (ref 65–99)
OSMOLALITY: 288 (ref 275–301)
Potassium: 4.3 mmol/L (ref 3.5–5.1)
SODIUM: 136 mmol/L (ref 136–145)

## 2014-01-20 LAB — PHOSPHORUS: Phosphorus: 2.6 mg/dL (ref 2.5–4.9)

## 2014-01-20 LAB — MAGNESIUM: MAGNESIUM: 1.6 mg/dL — AB

## 2014-01-20 LAB — CBC WITH DIFFERENTIAL/PLATELET
Basophil #: 0.1 10*3/uL (ref 0.0–0.1)
Basophil %: 0.6 %
Eosinophil #: 0.4 10*3/uL (ref 0.0–0.7)
Eosinophil %: 2.8 %
HCT: 51.3 % (ref 40.0–52.0)
HGB: 15.7 g/dL (ref 13.0–18.0)
LYMPHS PCT: 4.4 %
Lymphocyte #: 0.6 10*3/uL — ABNORMAL LOW (ref 1.0–3.6)
MCH: 30.8 pg (ref 26.0–34.0)
MCHC: 30.5 g/dL — ABNORMAL LOW (ref 32.0–36.0)
MCV: 101 fL — ABNORMAL HIGH (ref 80–100)
Monocyte #: 0.9 x10 3/mm (ref 0.2–1.0)
Monocyte %: 6.2 %
NEUTROS PCT: 86 %
Neutrophil #: 12 10*3/uL — ABNORMAL HIGH (ref 1.4–6.5)
PLATELETS: 218 10*3/uL (ref 150–440)
RBC: 5.08 10*6/uL (ref 4.40–5.90)
RDW: 15.3 % — AB (ref 11.5–14.5)
WBC: 13.9 10*3/uL — ABNORMAL HIGH (ref 3.8–10.6)

## 2014-01-20 LAB — TRIGLYCERIDES: Triglycerides: 99 mg/dL (ref 0–200)

## 2014-01-20 LAB — HEPARIN LEVEL (UNFRACTIONATED): ANTI-XA(UNFRACTIONATED): 0.38 [IU]/mL (ref 0.30–0.70)

## 2014-01-21 LAB — BASIC METABOLIC PANEL
Anion Gap: 2 — ABNORMAL LOW (ref 7–16)
BUN: 38 mg/dL — AB (ref 7–18)
CO2: 38 mmol/L — AB (ref 21–32)
Calcium, Total: 10.9 mg/dL — ABNORMAL HIGH (ref 8.5–10.1)
Chloride: 97 mmol/L — ABNORMAL LOW (ref 98–107)
Creatinine: 0.63 mg/dL (ref 0.60–1.30)
EGFR (African American): >60
EGFR (Non-African Amer.): >60
GLUCOSE: 135 mg/dL — AB (ref 65–99)
Osmolality: 285 (ref 275–301)
POTASSIUM: 4.7 mmol/L (ref 3.5–5.1)
Sodium: 137 mmol/L (ref 136–145)

## 2014-01-21 LAB — CBC WITH DIFFERENTIAL/PLATELET
BASOS ABS: 0 10*3/uL (ref 0.0–0.1)
BASOS PCT: 0.3 %
Eosinophil #: 0.5 10*3/uL (ref 0.0–0.7)
Eosinophil %: 3.6 %
HCT: 54.5 % — ABNORMAL HIGH (ref 40.0–52.0)
HGB: 16.8 g/dL (ref 13.0–18.0)
LYMPHS PCT: 7.6 %
Lymphocyte #: 1 10*3/uL (ref 1.0–3.6)
MCH: 30.8 pg (ref 26.0–34.0)
MCHC: 30.8 g/dL — ABNORMAL LOW (ref 32.0–36.0)
MCV: 100 fL (ref 80–100)
MONO ABS: 0.8 x10 3/mm (ref 0.2–1.0)
Monocyte %: 5.9 %
Neutrophil #: 11.3 10*3/uL — ABNORMAL HIGH (ref 1.4–6.5)
Neutrophil %: 82.6 %
Platelet: 246 10*3/uL (ref 150–440)
RBC: 5.46 10*6/uL (ref 4.40–5.90)
RDW: 15.7 % — ABNORMAL HIGH (ref 11.5–14.5)
WBC: 13.7 10*3/uL — ABNORMAL HIGH (ref 3.8–10.6)

## 2014-01-21 LAB — APTT: Activated PTT: 80 secs — ABNORMAL HIGH (ref 23.6–35.9)

## 2014-01-21 LAB — MAGNESIUM
MAGNESIUM: 1.6 mg/dL — AB
Magnesium: 1.8 mg/dL

## 2014-01-21 LAB — HEPARIN LEVEL (UNFRACTIONATED): ANTI-XA(UNFRACTIONATED): 0.38 [IU]/mL (ref 0.30–0.70)

## 2014-01-22 DIAGNOSIS — J9600 Acute respiratory failure, unspecified whether with hypoxia or hypercapnia: Secondary | ICD-10-CM | POA: Diagnosis not present

## 2014-01-22 LAB — BASIC METABOLIC PANEL
ANION GAP: 3 — AB (ref 7–16)
BUN: 33 mg/dL — ABNORMAL HIGH (ref 7–18)
CALCIUM: 10.8 mg/dL — AB (ref 8.5–10.1)
CHLORIDE: 97 mmol/L — AB (ref 98–107)
CO2: 38 mmol/L — AB (ref 21–32)
CREATININE: 0.5 mg/dL — AB (ref 0.60–1.30)
EGFR (African American): >60
EGFR (Non-African Amer.): >60
Glucose: 166 mg/dL — ABNORMAL HIGH (ref 65–99)
Osmolality: 287 (ref 275–301)
POTASSIUM: 4.8 mmol/L (ref 3.5–5.1)
Sodium: 138 mmol/L (ref 136–145)

## 2014-01-22 LAB — CBC WITH DIFFERENTIAL/PLATELET
BASOS ABS: 0 10*3/uL (ref 0.0–0.1)
Basophil %: 0.4 %
Eosinophil #: 0.6 10*3/uL (ref 0.0–0.7)
Eosinophil %: 5.4 %
HCT: 50.3 % (ref 40.0–52.0)
HGB: 16.1 g/dL (ref 13.0–18.0)
LYMPHS PCT: 7.5 %
Lymphocyte #: 0.8 10*3/uL — ABNORMAL LOW (ref 1.0–3.6)
MCH: 31.4 pg (ref 26.0–34.0)
MCHC: 32 g/dL (ref 32.0–36.0)
MCV: 98 fL (ref 80–100)
MONO ABS: 0.7 x10 3/mm (ref 0.2–1.0)
Monocyte %: 6.7 %
NEUTROS PCT: 80 %
Neutrophil #: 8.5 10*3/uL — ABNORMAL HIGH (ref 1.4–6.5)
Platelet: 236 10*3/uL (ref 150–440)
RBC: 5.12 10*6/uL (ref 4.40–5.90)
RDW: 15.6 % — ABNORMAL HIGH (ref 11.5–14.5)
WBC: 10.6 10*3/uL (ref 3.8–10.6)

## 2014-01-22 LAB — HEPARIN LEVEL (UNFRACTIONATED): Anti-Xa(Unfractionated): 0.58 IU/mL (ref 0.30–0.70)

## 2014-01-22 LAB — CLOSTRIDIUM DIFFICILE(ARMC)

## 2014-01-22 LAB — PROTIME-INR
INR: 1
Prothrombin Time: 13.2 secs (ref 11.5–14.7)

## 2014-01-22 LAB — APTT: ACTIVATED PTT: 27.5 s (ref 23.6–35.9)

## 2014-01-23 DIAGNOSIS — J9600 Acute respiratory failure, unspecified whether with hypoxia or hypercapnia: Secondary | ICD-10-CM | POA: Diagnosis not present

## 2014-01-23 LAB — CBC WITH DIFFERENTIAL/PLATELET
Basophil #: 0 10*3/uL (ref 0.0–0.1)
Basophil %: 0.3 %
EOS ABS: 0.7 10*3/uL (ref 0.0–0.7)
EOS PCT: 7 %
HCT: 49.7 % (ref 40.0–52.0)
HGB: 15.5 g/dL (ref 13.0–18.0)
Lymphocyte #: 1 10*3/uL (ref 1.0–3.6)
Lymphocyte %: 10.4 %
MCH: 30.9 pg (ref 26.0–34.0)
MCHC: 31.2 g/dL — AB (ref 32.0–36.0)
MCV: 99 fL (ref 80–100)
MONOS PCT: 6.4 %
Monocyte #: 0.6 x10 3/mm (ref 0.2–1.0)
NEUTROS ABS: 7 10*3/uL — AB (ref 1.4–6.5)
Neutrophil %: 75.9 %
Platelet: 193 10*3/uL (ref 150–440)
RBC: 5.03 10*6/uL (ref 4.40–5.90)
RDW: 15.6 % — ABNORMAL HIGH (ref 11.5–14.5)
WBC: 9.3 10*3/uL (ref 3.8–10.6)

## 2014-01-23 LAB — HEPARIN LEVEL (UNFRACTIONATED)
ANTI-XA(UNFRACTIONATED): 0.71 [IU]/mL — AB (ref 0.30–0.70)
Anti-Xa(Unfractionated): 0.85 IU/mL — ABNORMAL HIGH (ref 0.30–0.70)
Anti-Xa(Unfractionated): 0.59 IU/mL (ref 0.30–0.70)

## 2014-01-23 LAB — BASIC METABOLIC PANEL
Anion Gap: 3 — ABNORMAL LOW (ref 7–16)
BUN: 31 mg/dL — ABNORMAL HIGH (ref 7–18)
Calcium, Total: 10.3 mg/dL — ABNORMAL HIGH (ref 8.5–10.1)
Chloride: 97 mmol/L — ABNORMAL LOW (ref 98–107)
Co2: 38 mmol/L — ABNORMAL HIGH (ref 21–32)
Creatinine: 0.6 mg/dL (ref 0.60–1.30)
EGFR (African American): >60
EGFR (Non-African Amer.): >60
GLUCOSE: 146 mg/dL — AB (ref 65–99)
Osmolality: 285 (ref 275–301)
POTASSIUM: 4.9 mmol/L (ref 3.5–5.1)
SODIUM: 138 mmol/L (ref 136–145)

## 2014-01-24 DIAGNOSIS — J9600 Acute respiratory failure, unspecified whether with hypoxia or hypercapnia: Secondary | ICD-10-CM | POA: Diagnosis not present

## 2014-01-24 LAB — POTASSIUM: POTASSIUM: 4.9 mmol/L (ref 3.5–5.1)

## 2014-01-24 LAB — MAGNESIUM
MAGNESIUM: 1.2 mg/dL — AB
Magnesium: 1.4 mg/dL — ABNORMAL LOW

## 2014-01-24 LAB — CBC WITH DIFFERENTIAL/PLATELET
BASOS ABS: 0 10*3/uL (ref 0.0–0.1)
Basophil %: 0.2 %
EOS ABS: 0.7 10*3/uL (ref 0.0–0.7)
Eosinophil %: 7.6 %
HCT: 46.2 % (ref 40.0–52.0)
HGB: 14.5 g/dL (ref 13.0–18.0)
LYMPHS PCT: 9.7 %
Lymphocyte #: 1 10*3/uL (ref 1.0–3.6)
MCH: 31.1 pg (ref 26.0–34.0)
MCHC: 31.5 g/dL — AB (ref 32.0–36.0)
MCV: 99 fL (ref 80–100)
Monocyte #: 0.8 x10 3/mm (ref 0.2–1.0)
Monocyte %: 7.8 %
Neutrophil #: 7.4 10*3/uL — ABNORMAL HIGH (ref 1.4–6.5)
Neutrophil %: 74.7 %
PLATELETS: 197 10*3/uL (ref 150–440)
RBC: 4.67 10*6/uL (ref 4.40–5.90)
RDW: 15.5 % — AB (ref 11.5–14.5)
WBC: 9.9 10*3/uL (ref 3.8–10.6)

## 2014-01-24 LAB — BASIC METABOLIC PANEL
Anion Gap: 1 — ABNORMAL LOW (ref 7–16)
BUN: 30 mg/dL — AB (ref 7–18)
CALCIUM: 9.7 mg/dL (ref 8.5–10.1)
CO2: 37 mmol/L — AB (ref 21–32)
Chloride: 95 mmol/L — ABNORMAL LOW (ref 98–107)
Creatinine: 0.64 mg/dL (ref 0.60–1.30)
EGFR (African American): >60
Glucose: 186 mg/dL — ABNORMAL HIGH (ref 65–99)
OSMOLALITY: 277 (ref 275–301)
POTASSIUM: 5.2 mmol/L — AB (ref 3.5–5.1)
Sodium: 133 mmol/L — ABNORMAL LOW (ref 136–145)

## 2014-01-24 LAB — HEPARIN LEVEL (UNFRACTIONATED): Anti-Xa(Unfractionated): 0.57 IU/mL (ref 0.30–0.70)

## 2014-01-25 DIAGNOSIS — J9600 Acute respiratory failure, unspecified whether with hypoxia or hypercapnia: Secondary | ICD-10-CM | POA: Diagnosis not present

## 2014-01-25 LAB — CBC WITH DIFFERENTIAL/PLATELET
BASOS PCT: 0.3 %
Basophil #: 0 10*3/uL (ref 0.0–0.1)
Eosinophil #: 0.8 10*3/uL — ABNORMAL HIGH (ref 0.0–0.7)
Eosinophil %: 8.7 %
HCT: 45.4 % (ref 40.0–52.0)
HGB: 14.4 g/dL (ref 13.0–18.0)
LYMPHS PCT: 9.2 %
Lymphocyte #: 0.8 10*3/uL — ABNORMAL LOW (ref 1.0–3.6)
MCH: 31.3 pg (ref 26.0–34.0)
MCHC: 31.7 g/dL — ABNORMAL LOW (ref 32.0–36.0)
MCV: 99 fL (ref 80–100)
MONO ABS: 0.5 x10 3/mm (ref 0.2–1.0)
MONOS PCT: 5.7 %
NEUTROS PCT: 76.1 %
Neutrophil #: 6.7 10*3/uL — ABNORMAL HIGH (ref 1.4–6.5)
Platelet: 181 10*3/uL (ref 150–440)
RBC: 4.61 10*6/uL (ref 4.40–5.90)
RDW: 15.9 % — ABNORMAL HIGH (ref 11.5–14.5)
WBC: 8.8 10*3/uL (ref 3.8–10.6)

## 2014-01-25 LAB — BASIC METABOLIC PANEL
ANION GAP: 3 — AB (ref 7–16)
BUN: 28 mg/dL — ABNORMAL HIGH (ref 7–18)
Calcium, Total: 10.2 mg/dL — ABNORMAL HIGH (ref 8.5–10.1)
Chloride: 92 mmol/L — ABNORMAL LOW (ref 98–107)
Co2: 37 mmol/L — ABNORMAL HIGH (ref 21–32)
Creatinine: 0.54 mg/dL — ABNORMAL LOW (ref 0.60–1.30)
EGFR (Non-African Amer.): >60
Glucose: 189 mg/dL — ABNORMAL HIGH (ref 65–99)
Osmolality: 275 (ref 275–301)
Potassium: 5.2 mmol/L — ABNORMAL HIGH (ref 3.5–5.1)
Sodium: 132 mmol/L — ABNORMAL LOW (ref 136–145)

## 2014-01-25 LAB — PROTIME-INR
INR: 1
Prothrombin Time: 12.9 secs (ref 11.5–14.7)

## 2014-01-25 LAB — HEPARIN LEVEL (UNFRACTIONATED)
ANTI-XA(UNFRACTIONATED): 0.82 [IU]/mL — AB (ref 0.30–0.70)
Anti-Xa(Unfractionated): 0.7 IU/mL (ref 0.30–0.70)
Anti-Xa(Unfractionated): 0.69 IU/mL (ref 0.30–0.70)

## 2014-01-25 LAB — PHOSPHORUS: Phosphorus: 2.7 mg/dL (ref 2.5–4.9)

## 2014-01-25 LAB — MAGNESIUM
MAGNESIUM: 1.7 mg/dL — AB
Magnesium: 1.7 mg/dL — ABNORMAL LOW
Magnesium: 1.5 mg/dL — ABNORMAL LOW
Magnesium: 1.6 mg/dL — ABNORMAL LOW

## 2014-01-26 LAB — POTASSIUM: Potassium: 5.1 mmol/L (ref 3.5–5.1)

## 2014-01-26 LAB — MAGNESIUM
Magnesium: 1.6 mg/dL — ABNORMAL LOW
Magnesium: 2.5 mg/dL — ABNORMAL HIGH

## 2014-01-26 LAB — CBC WITH DIFFERENTIAL/PLATELET
BANDS NEUTROPHIL: 2 %
Eosinophil: 10 %
HCT: 45.3 % (ref 40.0–52.0)
HGB: 14.6 g/dL (ref 13.0–18.0)
Lymphocytes: 6 %
MCH: 31.4 pg (ref 26.0–34.0)
MCHC: 32.3 g/dL (ref 32.0–36.0)
MCV: 97 fL (ref 80–100)
Metamyelocyte: 2 %
Monocytes: 6 %
PLATELETS: 175 10*3/uL (ref 150–440)
RBC: 4.66 10*6/uL (ref 4.40–5.90)
RDW: 16.1 % — AB (ref 11.5–14.5)
Segmented Neutrophils: 73 %
VARIANT LYMPHOCYTE - H1-RLYMPH: 1 %
WBC: 8.4 10*3/uL (ref 3.8–10.6)

## 2014-01-26 LAB — HEPARIN LEVEL (UNFRACTIONATED)
ANTI-XA(UNFRACTIONATED): 0.9 [IU]/mL — AB (ref 0.30–0.70)
Anti-Xa(Unfractionated): 0.69 IU/mL (ref 0.30–0.70)
Anti-Xa(Unfractionated): 0.78 IU/mL — ABNORMAL HIGH (ref 0.30–0.70)

## 2014-01-26 LAB — BASIC METABOLIC PANEL
Anion Gap: 2 — ABNORMAL LOW (ref 7–16)
BUN: 21 mg/dL — ABNORMAL HIGH (ref 7–18)
CALCIUM: 10.4 mg/dL — AB (ref 8.5–10.1)
Chloride: 92 mmol/L — ABNORMAL LOW (ref 98–107)
Co2: 34 mmol/L — ABNORMAL HIGH (ref 21–32)
Creatinine: 0.44 mg/dL — ABNORMAL LOW (ref 0.60–1.30)
EGFR (African American): >60
EGFR (Non-African Amer.): >60
GLUCOSE: 139 mg/dL — AB (ref 65–99)
OSMOLALITY: 262 (ref 275–301)
POTASSIUM: 5.7 mmol/L — AB (ref 3.5–5.1)
Sodium: 128 mmol/L — ABNORMAL LOW (ref 136–145)

## 2014-01-26 LAB — PHOSPHORUS: PHOSPHORUS: 2.9 mg/dL (ref 2.5–4.9)

## 2014-01-27 LAB — TPN PANEL
ACTIVATED PTT: 31.1 s (ref 23.6–35.9)
ALBUMIN: 2 g/dL — AB (ref 3.4–5.0)
ANION GAP: 2 — AB (ref 7–16)
Alkaline Phosphatase: 72 U/L
BUN: 22 mg/dL — ABNORMAL HIGH (ref 7–18)
CREATININE: 0.54 mg/dL — AB (ref 0.60–1.30)
Calcium, Total: 10.4 mg/dL — ABNORMAL HIGH (ref 8.5–10.1)
Chloride: 91 mmol/L — ABNORMAL LOW (ref 98–107)
Cholesterol: 172 mg/dL (ref 0–200)
Co2: 36 mmol/L — ABNORMAL HIGH (ref 21–32)
EGFR (Non-African Amer.): >60
Glucose: 130 mg/dL — ABNORMAL HIGH (ref 65–99)
HGB: 14.2 g/dL (ref 13.0–18.0)
INR: 1
Magnesium: 1.4 mg/dL — ABNORMAL LOW
Osmolality: 264 (ref 275–301)
Phosphorus: 3.3 mg/dL (ref 2.5–4.9)
Platelet: 176 10*3/uL (ref 150–440)
Potassium: 5.9 mmol/L — ABNORMAL HIGH (ref 3.5–5.1)
Prothrombin Time: 12.7 secs (ref 11.5–14.7)
SGOT(AST): 23 U/L (ref 15–37)
Sodium: 129 mmol/L — ABNORMAL LOW (ref 136–145)
TOTAL PROTEIN: 6.8 g/dL (ref 6.4–8.2)
TRIGLYCERIDES: 105 mg/dL (ref 0–200)
WBC: 7.8 10*3/uL (ref 3.8–10.6)

## 2014-01-27 LAB — HEPARIN LEVEL (UNFRACTIONATED): Anti-Xa(Unfractionated): <0.1 IU/mL — ABNORMAL LOW (ref 0.30–0.70)

## 2014-01-28 LAB — TPN PANEL
ACTIVATED PTT: 27.9 s (ref 23.6–35.9)
ALBUMIN: 2.1 g/dL — AB (ref 3.4–5.0)
AST: 27 U/L (ref 15–37)
Alkaline Phosphatase: 82 U/L
Anion Gap: 4 — ABNORMAL LOW (ref 7–16)
BUN: 19 mg/dL — ABNORMAL HIGH (ref 7–18)
CALCIUM: 10.4 mg/dL — AB (ref 8.5–10.1)
CHLORIDE: 89 mmol/L — AB (ref 98–107)
CO2: 35 mmol/L — AB (ref 21–32)
Cholesterol: 188 mg/dL (ref 0–200)
Creatinine: 0.59 mg/dL — ABNORMAL LOW (ref 0.60–1.30)
EGFR (African American): >60
EGFR (Non-African Amer.): >60
Glucose: 165 mg/dL — ABNORMAL HIGH (ref 65–99)
HGB: 15 g/dL (ref 13.0–18.0)
INR: 1
Magnesium: 1.2 mg/dL — ABNORMAL LOW
Osmolality: 263 (ref 275–301)
PHOSPHORUS: 2.9 mg/dL (ref 2.5–4.9)
POTASSIUM: 5.6 mmol/L — AB (ref 3.5–5.1)
Platelet: 148 10*3/uL — ABNORMAL LOW (ref 150–440)
Prothrombin Time: 13 secs (ref 11.5–14.7)
Sodium: 128 mmol/L — ABNORMAL LOW (ref 136–145)
TOTAL PROTEIN: 7.1 g/dL (ref 6.4–8.2)
Triglycerides: 120 mg/dL (ref 0–200)
WBC: 9.3 10*3/uL (ref 3.8–10.6)

## 2014-01-29 ENCOUNTER — Ambulatory Visit: Payer: Self-pay | Admitting: Urology

## 2014-01-29 LAB — CBC WITH DIFFERENTIAL/PLATELET
BASOS PCT: 0.5 %
Basophil #: 0 10*3/uL (ref 0.0–0.1)
Eosinophil #: 0.4 10*3/uL (ref 0.0–0.7)
Eosinophil %: 4.9 %
HCT: 42.9 % (ref 40.0–52.0)
HGB: 13.8 g/dL (ref 13.0–18.0)
LYMPHS ABS: 0.7 10*3/uL — AB (ref 1.0–3.6)
Lymphocyte %: 8.8 %
MCH: 31.1 pg (ref 26.0–34.0)
MCHC: 32.2 g/dL (ref 32.0–36.0)
MCV: 97 fL (ref 80–100)
Monocyte #: 0.8 x10 3/mm (ref 0.2–1.0)
Monocyte %: 10 %
NEUTROS ABS: 6.2 10*3/uL (ref 1.4–6.5)
Neutrophil %: 75.8 %
Platelet: 120 10*3/uL — ABNORMAL LOW (ref 150–440)
RBC: 4.43 10*6/uL (ref 4.40–5.90)
RDW: 16.4 % — AB (ref 11.5–14.5)
WBC: 8.2 10*3/uL (ref 3.8–10.6)

## 2014-01-29 LAB — BASIC METABOLIC PANEL
Anion Gap: 0 — ABNORMAL LOW (ref 7–16)
BUN: 40 mg/dL — AB (ref 7–18)
CALCIUM: 10.4 mg/dL — AB (ref 8.5–10.1)
CO2: 39 mmol/L — AB (ref 21–32)
CREATININE: 0.72 mg/dL (ref 0.60–1.30)
Chloride: 88 mmol/L — ABNORMAL LOW (ref 98–107)
GLUCOSE: 183 mg/dL — AB (ref 65–99)
OSMOLALITY: 270 (ref 275–301)
Potassium: 4.9 mmol/L (ref 3.5–5.1)
Sodium: 127 mmol/L — ABNORMAL LOW (ref 136–145)

## 2014-01-29 LAB — MAGNESIUM: MAGNESIUM: 1.7 mg/dL — AB

## 2014-01-30 ENCOUNTER — Ambulatory Visit: Payer: Self-pay | Admitting: Internal Medicine

## 2014-01-30 LAB — BASIC METABOLIC PANEL
Anion Gap: 4 — ABNORMAL LOW (ref 7–16)
BUN: 54 mg/dL — AB (ref 7–18)
CHLORIDE: 89 mmol/L — AB (ref 98–107)
Calcium, Total: 10.4 mg/dL — ABNORMAL HIGH (ref 8.5–10.1)
Co2: 37 mmol/L — ABNORMAL HIGH (ref 21–32)
Creatinine: 0.89 mg/dL (ref 0.60–1.30)
EGFR (African American): >60
Glucose: 189 mg/dL — ABNORMAL HIGH (ref 65–99)
OSMOLALITY: 281 (ref 275–301)
POTASSIUM: 4.5 mmol/L (ref 3.5–5.1)
SODIUM: 130 mmol/L — AB (ref 136–145)

## 2014-01-30 LAB — CBC WITH DIFFERENTIAL/PLATELET
BASOS ABS: 0.1 10*3/uL (ref 0.0–0.1)
BASOS PCT: 0.4 %
EOS PCT: 3.1 %
Eosinophil #: 0.5 10*3/uL (ref 0.0–0.7)
HCT: 39.8 % — AB (ref 40.0–52.0)
HGB: 12.7 g/dL — AB (ref 13.0–18.0)
LYMPHS ABS: 0.9 10*3/uL — AB (ref 1.0–3.6)
LYMPHS PCT: 6 %
MCH: 31 pg (ref 26.0–34.0)
MCHC: 32 g/dL (ref 32.0–36.0)
MCV: 97 fL (ref 80–100)
Monocyte #: 1 x10 3/mm (ref 0.2–1.0)
Monocyte %: 6.5 %
Neutrophil #: 12.9 10*3/uL — ABNORMAL HIGH (ref 1.4–6.5)
Neutrophil %: 84 %
Platelet: 131 10*3/uL — ABNORMAL LOW (ref 150–440)
RBC: 4.11 10*6/uL — AB (ref 4.40–5.90)
RDW: 16.7 % — AB (ref 11.5–14.5)
WBC: 15.4 10*3/uL — AB (ref 3.8–10.6)

## 2014-01-30 LAB — PHOSPHORUS: PHOSPHORUS: 3 mg/dL (ref 2.5–4.9)

## 2014-01-30 LAB — MAGNESIUM: Magnesium: 1.7 mg/dL — ABNORMAL LOW

## 2014-01-31 ENCOUNTER — Ambulatory Visit: Payer: Self-pay

## 2014-01-31 LAB — BASIC METABOLIC PANEL
ANION GAP: 4 — AB (ref 7–16)
BUN: 70 mg/dL — ABNORMAL HIGH (ref 7–18)
CREATININE: 0.81 mg/dL (ref 0.60–1.30)
Calcium, Total: 10.5 mg/dL — ABNORMAL HIGH (ref 8.5–10.1)
Chloride: 91 mmol/L — ABNORMAL LOW (ref 98–107)
Co2: 38 mmol/L — ABNORMAL HIGH (ref 21–32)
Glucose: 175 mg/dL — ABNORMAL HIGH (ref 65–99)
OSMOLALITY: 291 (ref 275–301)
Potassium: 3.6 mmol/L (ref 3.5–5.1)
Sodium: 133 mmol/L — ABNORMAL LOW (ref 136–145)

## 2014-01-31 LAB — CBC WITH DIFFERENTIAL/PLATELET
BASOS PCT: 0.5 %
Basophil #: 0.1 10*3/uL (ref 0.0–0.1)
EOS PCT: 4.4 %
Eosinophil #: 0.7 10*3/uL (ref 0.0–0.7)
HCT: 39.3 % — ABNORMAL LOW (ref 40.0–52.0)
HGB: 12.3 g/dL — ABNORMAL LOW (ref 13.0–18.0)
LYMPHS ABS: 1 10*3/uL (ref 1.0–3.6)
Lymphocyte %: 6.7 %
MCH: 30.4 pg (ref 26.0–34.0)
MCHC: 31.4 g/dL — ABNORMAL LOW (ref 32.0–36.0)
MCV: 97 fL (ref 80–100)
MONOS PCT: 6.8 %
Monocyte #: 1.1 x10 3/mm — ABNORMAL HIGH (ref 0.2–1.0)
Neutrophil #: 12.7 10*3/uL — ABNORMAL HIGH (ref 1.4–6.5)
Neutrophil %: 81.6 %
PLATELETS: 151 10*3/uL (ref 150–440)
RBC: 4.06 10*6/uL — AB (ref 4.40–5.90)
RDW: 16.5 % — ABNORMAL HIGH (ref 11.5–14.5)
WBC: 15.6 10*3/uL — AB (ref 3.8–10.6)

## 2014-01-31 LAB — URINALYSIS, COMPLETE
BILIRUBIN, UR: NEGATIVE
Bacteria: NONE SEEN
Glucose,UR: NEGATIVE mg/dL (ref 0–75)
Ketone: NEGATIVE
Leukocyte Esterase: NEGATIVE
Nitrite: NEGATIVE
PROTEIN: NEGATIVE
Ph: 5 (ref 4.5–8.0)
SPECIFIC GRAVITY: 1.014 (ref 1.003–1.030)
SQUAMOUS EPITHELIAL: NONE SEEN

## 2014-01-31 LAB — PHOSPHORUS
Phosphorus: 2.4 mg/dL — ABNORMAL LOW (ref 2.5–4.9)
Phosphorus: 2.7 mg/dL (ref 2.5–4.9)

## 2014-01-31 LAB — POTASSIUM: POTASSIUM: 3.7 mmol/L (ref 3.5–5.1)

## 2014-01-31 LAB — MAGNESIUM: Magnesium: 2 mg/dL

## 2014-02-01 LAB — BASIC METABOLIC PANEL
Anion Gap: 5 — ABNORMAL LOW (ref 7–16)
BUN: 72 mg/dL — AB (ref 7–18)
CREATININE: 0.74 mg/dL (ref 0.60–1.30)
Calcium, Total: 10.9 mg/dL — ABNORMAL HIGH (ref 8.5–10.1)
Chloride: 95 mmol/L — ABNORMAL LOW (ref 98–107)
Co2: 38 mmol/L — ABNORMAL HIGH (ref 21–32)
EGFR (African American): >60
EGFR (Non-African Amer.): >60
GLUCOSE: 168 mg/dL — AB (ref 65–99)
Osmolality: 301 (ref 275–301)
Potassium: 3.6 mmol/L (ref 3.5–5.1)
Sodium: 138 mmol/L (ref 136–145)

## 2014-02-01 LAB — CBC WITH DIFFERENTIAL/PLATELET
BASOS PCT: 0.3 %
Basophil #: 0 10*3/uL (ref 0.0–0.1)
EOS PCT: 10.6 %
Eosinophil #: 1.4 10*3/uL — ABNORMAL HIGH (ref 0.0–0.7)
HCT: 37.8 % — ABNORMAL LOW (ref 40.0–52.0)
HGB: 12.2 g/dL — AB (ref 13.0–18.0)
Lymphocyte #: 1 10*3/uL (ref 1.0–3.6)
Lymphocyte %: 7.7 %
MCH: 30.9 pg (ref 26.0–34.0)
MCHC: 32.1 g/dL (ref 32.0–36.0)
MCV: 96 fL (ref 80–100)
MONO ABS: 1 x10 3/mm (ref 0.2–1.0)
MONOS PCT: 8.2 %
NEUTROS PCT: 73.2 %
Neutrophil #: 9.4 10*3/uL — ABNORMAL HIGH (ref 1.4–6.5)
Platelet: 193 10*3/uL (ref 150–440)
RBC: 3.93 10*6/uL — AB (ref 4.40–5.90)
RDW: 16.6 % — ABNORMAL HIGH (ref 11.5–14.5)
WBC: 12.8 10*3/uL — AB (ref 3.8–10.6)

## 2014-02-01 LAB — MAGNESIUM: Magnesium: 1.9 mg/dL

## 2014-02-01 LAB — POTASSIUM
Potassium: 3.8 mmol/L (ref 3.5–5.1)
Potassium: 4 mmol/L (ref 3.5–5.1)

## 2014-02-01 LAB — PHOSPHORUS
PHOSPHORUS: 2 mg/dL — AB (ref 2.5–4.9)
PHOSPHORUS: 2.3 mg/dL — AB (ref 2.5–4.9)
Phosphorus: 1.8 mg/dL — ABNORMAL LOW (ref 2.5–4.9)

## 2014-02-02 LAB — BASIC METABOLIC PANEL
Anion Gap: 4 — ABNORMAL LOW (ref 7–16)
BUN: 60 mg/dL — AB (ref 7–18)
CREATININE: 0.66 mg/dL (ref 0.60–1.30)
Calcium, Total: 10.5 mg/dL — ABNORMAL HIGH (ref 8.5–10.1)
Chloride: 98 mmol/L (ref 98–107)
Co2: 39 mmol/L — ABNORMAL HIGH (ref 21–32)
EGFR (African American): >60
GLUCOSE: 170 mg/dL — AB (ref 65–99)
Osmolality: 302 (ref 275–301)
Potassium: 4.1 mmol/L (ref 3.5–5.1)
SODIUM: 141 mmol/L (ref 136–145)

## 2014-02-02 LAB — CBC WITH DIFFERENTIAL/PLATELET
BASOS PCT: 0.3 %
Basophil #: 0 10*3/uL (ref 0.0–0.1)
EOS ABS: 1 10*3/uL — AB (ref 0.0–0.7)
EOS PCT: 9.3 %
HCT: 37 % — AB (ref 40.0–52.0)
HGB: 11.7 g/dL — AB (ref 13.0–18.0)
Lymphocyte #: 0.9 10*3/uL — ABNORMAL LOW (ref 1.0–3.6)
Lymphocyte %: 9 %
MCH: 30.8 pg (ref 26.0–34.0)
MCHC: 31.6 g/dL — ABNORMAL LOW (ref 32.0–36.0)
MCV: 98 fL (ref 80–100)
Monocyte #: 0.9 x10 3/mm (ref 0.2–1.0)
Monocyte %: 9.1 %
NEUTROS ABS: 7.5 10*3/uL — AB (ref 1.4–6.5)
NEUTROS PCT: 72.3 %
Platelet: 232 10*3/uL (ref 150–440)
RBC: 3.79 10*6/uL — AB (ref 4.40–5.90)
RDW: 16.3 % — ABNORMAL HIGH (ref 11.5–14.5)
WBC: 10.4 10*3/uL (ref 3.8–10.6)

## 2014-02-02 LAB — PHOSPHORUS: Phosphorus: 2.3 mg/dL — ABNORMAL LOW (ref 2.5–4.9)

## 2014-02-02 LAB — MAGNESIUM: Magnesium: 1.6 mg/dL — ABNORMAL LOW

## 2014-02-03 LAB — MAGNESIUM
Magnesium: 1.6 mg/dL — ABNORMAL LOW
Magnesium: 1.8 mg/dL

## 2014-02-03 LAB — BASIC METABOLIC PANEL
ANION GAP: 3 — AB (ref 7–16)
BUN: 47 mg/dL — ABNORMAL HIGH (ref 7–18)
CHLORIDE: 101 mmol/L (ref 98–107)
CO2: 39 mmol/L — AB (ref 21–32)
CREATININE: 0.59 mg/dL — AB (ref 0.60–1.30)
Calcium, Total: 10.4 mg/dL — ABNORMAL HIGH (ref 8.5–10.1)
EGFR (African American): >60
Glucose: 168 mg/dL — ABNORMAL HIGH (ref 65–99)
Osmolality: 301 (ref 275–301)
Potassium: 4.1 mmol/L (ref 3.5–5.1)
SODIUM: 143 mmol/L (ref 136–145)

## 2014-02-03 LAB — PHOSPHORUS
Phosphorus: 1.6 mg/dL — ABNORMAL LOW (ref 2.5–4.9)
Phosphorus: 2.1 mg/dL — ABNORMAL LOW (ref 2.5–4.9)
Phosphorus: 2 mg/dL — ABNORMAL LOW (ref 2.5–4.9)

## 2014-02-03 LAB — EXPECTORATED SPUTUM ASSESSMENT W REFEX TO RESP CULTURE

## 2014-02-04 LAB — MAGNESIUM: Magnesium: 1.6 mg/dL — ABNORMAL LOW

## 2014-02-04 LAB — CULTURE, BLOOD (SINGLE)

## 2014-02-04 LAB — RAPID HIV SCREEN (HIV 1/2 AB+AG)

## 2014-02-04 LAB — PHOSPHORUS: Phosphorus: 2 mg/dL — ABNORMAL LOW (ref 2.5–4.9)

## 2014-02-05 ENCOUNTER — Ambulatory Visit (HOSPITAL_COMMUNITY)
Admission: AD | Admit: 2014-02-05 | Discharge: 2014-02-05 | Disposition: A | Discharge status: Home / Self Care | Payer: Self-pay | Source: Other Acute Inpatient Hospital | Attending: Internal Medicine | Admitting: Internal Medicine

## 2014-02-05 ENCOUNTER — Inpatient Hospital Stay
Admission: AD | Admit: 2014-02-05 | Discharge: 2014-05-01 | Disposition: E | Payer: Medicare Other | Source: Ambulatory Visit | Attending: Internal Medicine | Admitting: Internal Medicine

## 2014-02-05 DIAGNOSIS — E119 Type 2 diabetes mellitus without complications: Secondary | ICD-10-CM

## 2014-02-05 DIAGNOSIS — K567 Ileus, unspecified: Secondary | ICD-10-CM

## 2014-02-05 DIAGNOSIS — J9690 Respiratory failure, unspecified, unspecified whether with hypoxia or hypercapnia: Secondary | ICD-10-CM

## 2014-02-05 DIAGNOSIS — I2510 Atherosclerotic heart disease of native coronary artery without angina pectoris: Secondary | ICD-10-CM | POA: Diagnosis present

## 2014-02-05 DIAGNOSIS — Z930 Tracheostomy status: Secondary | ICD-10-CM

## 2014-02-05 DIAGNOSIS — Z9911 Dependence on respirator [ventilator] status: Secondary | ICD-10-CM

## 2014-02-05 DIAGNOSIS — L8990 Pressure ulcer of unspecified site, unspecified stage: Secondary | ICD-10-CM | POA: Insufficient documentation

## 2014-02-05 DIAGNOSIS — L0291 Cutaneous abscess, unspecified: Secondary | ICD-10-CM

## 2014-02-05 DIAGNOSIS — J449 Chronic obstructive pulmonary disease, unspecified: Secondary | ICD-10-CM | POA: Diagnosis present

## 2014-02-05 DIAGNOSIS — J189 Pneumonia, unspecified organism: Secondary | ICD-10-CM

## 2014-02-05 DIAGNOSIS — R509 Fever, unspecified: Secondary | ICD-10-CM

## 2014-02-05 DIAGNOSIS — J9610 Chronic respiratory failure, unspecified whether with hypoxia or hypercapnia: Secondary | ICD-10-CM | POA: Diagnosis present

## 2014-02-05 DIAGNOSIS — B019 Varicella without complication: Secondary | ICD-10-CM

## 2014-02-05 DIAGNOSIS — J9620 Acute and chronic respiratory failure, unspecified whether with hypoxia or hypercapnia: Secondary | ICD-10-CM

## 2014-02-05 DIAGNOSIS — I10 Essential (primary) hypertension: Secondary | ICD-10-CM | POA: Diagnosis present

## 2014-02-05 DIAGNOSIS — G4733 Obstructive sleep apnea (adult) (pediatric): Secondary | ICD-10-CM | POA: Diagnosis present

## 2014-02-05 DIAGNOSIS — I5022 Chronic systolic (congestive) heart failure: Secondary | ICD-10-CM | POA: Diagnosis present

## 2014-02-05 DIAGNOSIS — Z720 Tobacco use: Secondary | ICD-10-CM | POA: Diagnosis present

## 2014-02-05 DIAGNOSIS — B029 Zoster without complications: Secondary | ICD-10-CM

## 2014-02-05 DIAGNOSIS — R9389 Abnormal findings on diagnostic imaging of other specified body structures: Secondary | ICD-10-CM

## 2014-02-05 DIAGNOSIS — O2230 Deep phlebothrombosis in pregnancy, unspecified trimester: Secondary | ICD-10-CM

## 2014-02-05 DIAGNOSIS — R140 Abdominal distension (gaseous): Secondary | ICD-10-CM

## 2014-02-05 DIAGNOSIS — I729 Aneurysm of unspecified site: Secondary | ICD-10-CM

## 2014-02-05 LAB — BLOOD GAS, ARTERIAL
Acid-Base Excess: 12.9 mmol/L — ABNORMAL HIGH (ref 0.0–2.0)
BICARBONATE: 38.2 meq/L — AB (ref 20.0–24.0)
FIO2: 0.45 %
O2 Saturation: 98.7 %
PCO2 ART: 62.3 mmHg — AB (ref 35.0–45.0)
PEEP: 8 cmH2O
Patient temperature: 98.6
RATE: 24 resp/min
TCO2: 40.1 mmol/L (ref 0–100)
VT: 550 mL
pH, Arterial: 7.404 (ref 7.350–7.450)
pO2, Arterial: 157 mmHg — ABNORMAL HIGH (ref 80.0–100.0)

## 2014-02-05 LAB — CBC WITH DIFFERENTIAL/PLATELET
Basophil #: 0.1 10*3/uL (ref 0.0–0.1)
Basophil %: 0.6 %
Eosinophil #: 0.4 10*3/uL (ref 0.0–0.7)
Eosinophil %: 4 %
HCT: 33.7 % — ABNORMAL LOW (ref 40.0–52.0)
HGB: 10.6 g/dL — AB (ref 13.0–18.0)
LYMPHS ABS: 1.3 10*3/uL (ref 1.0–3.6)
LYMPHS PCT: 11.3 %
MCH: 30.5 pg (ref 26.0–34.0)
MCHC: 31.4 g/dL — AB (ref 32.0–36.0)
MCV: 97 fL (ref 80–100)
MONO ABS: 1 x10 3/mm (ref 0.2–1.0)
MONOS PCT: 9.1 %
NEUTROS PCT: 75 %
Neutrophil #: 8.3 10*3/uL — ABNORMAL HIGH (ref 1.4–6.5)
Platelet: 289 10*3/uL (ref 150–440)
RBC: 3.47 10*6/uL — ABNORMAL LOW (ref 4.40–5.90)
RDW: 16.4 % — ABNORMAL HIGH (ref 11.5–14.5)
WBC: 11 10*3/uL — ABNORMAL HIGH (ref 3.8–10.6)

## 2014-02-05 LAB — BASIC METABOLIC PANEL
ANION GAP: 0 — AB (ref 7–16)
BUN: 40 mg/dL — ABNORMAL HIGH (ref 7–18)
Calcium, Total: 9.4 mg/dL (ref 8.5–10.1)
Chloride: 101 mmol/L (ref 98–107)
Co2: 41 mmol/L (ref 21–32)
Creatinine: 0.63 mg/dL (ref 0.60–1.30)
EGFR (African American): >60
EGFR (Non-African Amer.): >60
Glucose: 168 mg/dL — ABNORMAL HIGH (ref 65–99)
Osmolality: 297 (ref 275–301)
Potassium: 4.6 mmol/L (ref 3.5–5.1)
Sodium: 142 mmol/L (ref 136–145)

## 2014-02-05 LAB — CLOSTRIDIUM DIFFICILE BY PCR: Toxigenic C. Difficile by PCR: NEGATIVE

## 2014-02-05 LAB — PHOSPHORUS: Phosphorus: 2.2 mg/dL — ABNORMAL LOW (ref 2.5–4.9)

## 2014-02-05 LAB — MAGNESIUM: MAGNESIUM: 1.8 mg/dL

## 2014-02-06 ENCOUNTER — Other Ambulatory Visit (HOSPITAL_COMMUNITY): Payer: Medicare Other

## 2014-02-06 DIAGNOSIS — Z930 Tracheostomy status: Secondary | ICD-10-CM

## 2014-02-06 DIAGNOSIS — B019 Varicella without complication: Secondary | ICD-10-CM

## 2014-02-06 DIAGNOSIS — B029 Zoster without complications: Secondary | ICD-10-CM

## 2014-02-06 DIAGNOSIS — Z9911 Dependence on respirator [ventilator] status: Secondary | ICD-10-CM

## 2014-02-06 DIAGNOSIS — O2230 Deep phlebothrombosis in pregnancy, unspecified trimester: Secondary | ICD-10-CM

## 2014-02-06 DIAGNOSIS — J9620 Acute and chronic respiratory failure, unspecified whether with hypoxia or hypercapnia: Secondary | ICD-10-CM

## 2014-02-06 LAB — COMPREHENSIVE METABOLIC PANEL
ALK PHOS: 55 U/L (ref 39–117)
ALT: 33 U/L (ref 0–53)
ANION GAP: 3 — AB (ref 5–15)
AST: 42 U/L — ABNORMAL HIGH (ref 0–37)
Albumin: 1.9 g/dL — ABNORMAL LOW (ref 3.5–5.2)
BUN: 32 mg/dL — ABNORMAL HIGH (ref 6–23)
CALCIUM: 9.6 mg/dL (ref 8.4–10.5)
CO2: 37 mmol/L — AB (ref 19–32)
Chloride: 100 mEq/L (ref 96–112)
Creatinine, Ser: 0.44 mg/dL — ABNORMAL LOW (ref 0.50–1.35)
GFR calc Af Amer: >90 mL/min (ref >90)
GFR calc non Af Amer: >90 mL/min (ref >90)
Glucose, Bld: 166 mg/dL — ABNORMAL HIGH (ref 70–99)
Potassium: 4.3 mmol/L (ref 3.5–5.1)
SODIUM: 140 mmol/L (ref 135–145)
TOTAL PROTEIN: 5.8 g/dL — AB (ref 6.0–8.3)
Total Bilirubin: 0.6 mg/dL (ref 0.3–1.2)

## 2014-02-06 LAB — CBC
HCT: 33.9 % — ABNORMAL LOW (ref 39.0–52.0)
Hemoglobin: 10 g/dL — ABNORMAL LOW (ref 13.0–17.0)
MCH: 29.4 pg (ref 26.0–34.0)
MCHC: 29.5 g/dL — ABNORMAL LOW (ref 30.0–36.0)
MCV: 99.7 fL (ref 78.0–100.0)
Platelets: 281 10*3/uL (ref 150–400)
RBC: 3.4 MIL/uL — ABNORMAL LOW (ref 4.22–5.81)
RDW: 15.9 % — AB (ref 11.5–15.5)
WBC: 10.8 10*3/uL — ABNORMAL HIGH (ref 4.0–10.5)

## 2014-02-06 NOTE — Consult Note (Signed)
Name: Robert Mccarthy MRN: [id number] DOB: [birth year]    ADMISSION DATE:  02/27/2014 CONSULTATION DATE: 1/7  REFERRING MD : [facility]  CHIEF COMPLAINT:  FTT  BRIEF PATIENT DESCRIPTION:  64 yo male admitted to [facility] 01/01/14 with AECOPD with VDRF and eventual tracheostomy, sepsis with Staph bacteremia, Varicella zoster, CHF, PE/DVT.  He was transferred to [facility] for vent weaning.  SIGNIFICANT EVENTS  1/7 tx to ssh  STUDIES:  1/8 CT neck >>  HISTORY OF PRESENT ILLNESS:    Robert Mccarthy is a 64 yo WM with an extensive pmh who was admitted to [facility] 01/01/14 with AECOPD that led to intubation and subsequent tracheostomy. He further developed complications that included, sepsis from staph bacteremia, varicella zoster confirmed with tissue bx,  CHF, DVT, PE (on heparin till bleeding issues stopped heparin) taken to OR per ENT and trach cauterized. He has been evaluated by Palliative Care but family wants aggressive care so he was discharged to [facility] 02/15/2014 and PCCM asked to assist in his care. He is on peep of 8, tracheal site is +hematoma and he is refractory to weaning attempts.  PAST MEDICAL HISTORY :   has a past medical history of DM2 (diabetes mellitus, type 2); COPD (chronic obstructive pulmonary disease); OSA (obstructive sleep apnea); Morbid obesity; Hypertension; Chronic systolic heart failure; Coronary artery disease; Chronic respiratory failure; Tobacco abuse; and Pneumonia.  has past surgical history that includes Cataract extraction; Hernia repair; and Cardiac catheterization (06/20/12). Prior to Admission medications   Medication Sig Start Date End Date Taking? Authorizing Provider  albuterol (PROVENTIL HFA;VENTOLIN HFA) 108 (90 BASE) MCG/ACT inhaler Inhale 2 puffs into the lungs every 4 (four) hours as needed for wheezing.    Historical Provider, MD  aspirin 81 MG tablet Take 1 tablet (81 mg total) by mouth daily. 05/20/12   Robert Ouch, MD  furosemide (LASIX) 40 MG tablet Take 40 mg  by mouth daily.     Historical Provider, MD  glimepiride (AMARYL) 4 MG tablet Take 4 mg by mouth daily before breakfast.    Historical Provider, MD  ipratropium-albuterol (DUONEB) 0.5-2.5 (3) MG/3ML SOLN Take 3 mLs by nebulization every 4 (four) hours as needed.    Historical Provider, MD  lisinopril (PRINIVIL,ZESTRIL) 40 MG tablet take 1 tablet by mouth once daily **INSURANCE REQUESTS 90 DAY SUPPLY.OUTCOMES.** 11/13/13   Robert Ouch, MD  metoprolol succinate (TOPROL-XL) 50 MG 24 hr tablet take 1 tablet by mouth once daily TAKE WITH OR IMMEDIATELY FOLLOWING A MEAL 11/04/13   Robert Ouch, MD  NON FORMULARY Oxygen 2 liters daily    Historical Provider, MD  pramipexole (MIRAPEX) 1 MG tablet Take 1 mg by mouth daily.    Historical Provider, MD  tiotropium (SPIRIVA) 18 MCG inhalation capsule Place 18 mcg into inhaler and inhale daily.    Historical Provider, MD   Allergies  Allergen Reactions  . Androgel [Testosterone] Shortness Of Breath    Other reaction(s): Localized superficial swelling of skin swelling    FAMILY HISTORY:  family history includes Heart disease in his father; Hyperlipidemia in his father; Hypertension in his father. SOCIAL HISTORY:  reports that he has been smoking Cigarettes.  He has a 20 pack-year smoking history. He quit smokeless tobacco use about 20 months ago. He reports that he does not drink alcohol or use illicit drugs.  REVIEW OF SYSTEMS:   NA SUBJECTIVE:   VITAL SIGNS: Vital signs reviewed. Abnormal values will appear under impression plan section.   PHYSICAL  EXAMINATION: General:  Obese, white male, increased wob, uncomfortable Neuro:  No follows commands, wd to pain HEENT:  Trach site with large hematoma surrounding stoma Cardiovascular:  HSD Lungs: Coarse rhonchi bilaterally Abdomen:  Peg, Obese. Disseminated rash  Musculoskeletal: Intact Skin: Lower extremities with chronic vascular changes            Recent Labs Lab  02/06/14 0500  NA 140  K 4.3  CL 100  CO2 37*  BUN 32*  CREATININE 0.44*  GLUCOSE 166*    Recent Labs Lab 02/06/14 0500  HGB 10.0*  HCT 33.9*  WBC 10.8*  PLT 281   Dg Chest Port 1 View  02/06/2014   CLINICAL DATA:  64 year old male with respiratory failure, shingles. Initial encounter.  EXAM: PORTABLE CHEST - 1 VIEW  COMPARISON:  01/30/2014 and earlier.  FINDINGS: Portable AP semi upright view at 0617 hrs. Tracheostomy tube remains in place. Left side PICC line. Stable large lung volumes with basilar predominant Patchy and confluent opacity, slightly worse on the left. No pneumothorax. No pulmonary edema or definite effusion. Overall ventilation is stable.  IMPRESSION: 1.  Stable lines and tubes. 2. Stable ventilation with bibasilar opacity suspicious for pneumonia superimposed on hyperinflation.   Electronically Signed   By: Augusto GambleLee  Mccarthy M.D.   On: 02/06/2014 08:02    ASSESSMENT   Acute on chronic respiratory failure 2nd to AECOPD. Hx of OSA. Tobacco abuse. Plan: Vent wean as tolerate [facility] Scheduled BDs F/u CXR intermittently  S/p tracheostomy with stomal bleeding/swelling around trach ?hematoma. Plan: D/w Dr. Sharyon MedicusHijazi >> will get CT neck to further assess, and then decide if f/u with ENT needed  PE/DVT. Plan: Per primary team  Hx of HTN, systolic CHF, CAD. Plan: Per primary team Negative fluid balance as tolerated  Staph bacteremia. Varicella zoster. Plan: Per primary team     Robert CanalesSteve Mccarthy ACNP Robert PollackLe Mccarthy PCCM Pager [phone] till 3 pm If no answer page [phone] 02/06/2014, 1:39 PM  Reviewed above, examined.  64 yo with AECOPD s/p VDRF and trach.  Now with trach site hematoma.  Has PE/DVT >> off anticoagulation due to trach site bleeding.  Volume overloaded.  Will need CT neck.  Diurese as tolerated.  Abx per primary team.  Not ready for vent weaning yet until FiO2/PEEP needs better.  Robert HellingVineet Muzammil Bruins, MD [facility] 02/06/2014, 3:30  PM Pager:  [phone] After 3pm call: [phone]

## 2014-02-07 ENCOUNTER — Encounter: Payer: Self-pay | Admitting: Radiology

## 2014-02-07 ENCOUNTER — Other Ambulatory Visit (HOSPITAL_COMMUNITY): Payer: Medicare Other

## 2014-02-07 MED ORDER — IOHEXOL 300 MG/ML  SOLN: 75.0000 mL | Freq: Once | INTRAMUSCULAR | Status: AC | PRN

## 2014-02-08 LAB — GRAM STAIN

## 2014-02-09 ENCOUNTER — Other Ambulatory Visit (HOSPITAL_COMMUNITY): Payer: Medicare Other

## 2014-02-09 DIAGNOSIS — Z930 Tracheostomy status: Secondary | ICD-10-CM

## 2014-02-09 DIAGNOSIS — J439 Emphysema, unspecified: Secondary | ICD-10-CM

## 2014-02-09 LAB — BASIC METABOLIC PANEL
ANION GAP: 4 — AB (ref 5–15)
BUN: 34 mg/dL — AB (ref 6–23)
CALCIUM: 9.2 mg/dL (ref 8.4–10.5)
CO2: 34 mmol/L — ABNORMAL HIGH (ref 19–32)
Chloride: 98 mEq/L (ref 96–112)
Creatinine, Ser: 0.53 mg/dL (ref 0.50–1.35)
Glucose, Bld: 171 mg/dL — ABNORMAL HIGH (ref 70–99)
Potassium: 4.7 mmol/L (ref 3.5–5.1)
Sodium: 136 mmol/L (ref 135–145)

## 2014-02-09 LAB — CBC
HCT: 33.3 % — ABNORMAL LOW (ref 39.0–52.0)
Hemoglobin: 9.9 g/dL — ABNORMAL LOW (ref 13.0–17.0)
MCH: 29.3 pg (ref 26.0–34.0)
MCHC: 29.7 g/dL — ABNORMAL LOW (ref 30.0–36.0)
MCV: 98.5 fL (ref 78.0–100.0)
PLATELETS: 266 10*3/uL (ref 150–400)
RBC: 3.38 MIL/uL — ABNORMAL LOW (ref 4.22–5.81)
RDW: 16.1 % — AB (ref 11.5–15.5)
WBC: 13.9 10*3/uL — ABNORMAL HIGH (ref 4.0–10.5)

## 2014-02-09 NOTE — Progress Notes (Signed)
   Name: Robert Mccarthy MRN: [id number] DOB: [birth year]    ADMISSION DATE:  02/13/2014 CONSULTATION DATE: 1/7  [facility]  CHIEF COMPLAINT:  FTT  BRIEF PATIENT DESCRIPTION:  64 [facility] with AECOPD with VDRF and eventual tracheostomy, sepsis with Staph bacteremia, Varicella zoster, CHF, PE/DVT.  [facility]  SIGNIFICANT EVENTS  1/7 tx to ssh  STUDIES:  1/8 CT neck >>NAD  HISTORY OF PRESENT ILLNESS:    Mr. 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 He further developed complications that included, sepsis from staph bacteremia, varicella zoster confirmed with tissue bx,  CHF, DVT, PE (on heparin till bleeding issues stopped heparin) taken to OR per ENT and trach cauterized. He has been evaluated by Palliative Care but family wants aggressive care so [facility] and PCCM asked to assist in his care. He is on peep of 8, tracheal site is +hematoma and he is refractory to weaning attempts.   SUBJECTIVE:  Not weaning 11/11 VITAL SIGNS: Vital signs reviewed. Abnormal values will appear under impression plan section.   PHYSICAL EXAMINATION: General:  Obese, white male, increased wob, uncomfortable, NSC, air hunger  Neuro:  No follows commands, wd to pain HEENT:  Trach site better , decreased swelling still with packing around stoma Cardiovascular:  HSD Lungs: Coarse rhonchi bilaterally Abdomen:  Peg, Obese. Disseminated rash better Musculoskeletal: Intact Skin: Lower extremities with chronic vascular changes            Recent Labs Lab 02/06/14 0500 02/09/14 0600  NA 140 136  K 4.3 4.7  CL 100 98  CO2 37* 34*  BUN 32* 34*  CREATININE 0.44* 0.53  GLUCOSE 166* 171*    Recent Labs Lab 02/06/14 0500 02/09/14 0600  HGB 10.0* 9.9*  HCT 33.9* 33.3*  WBC 10.8* 13.9*  PLT 281 266    Dg Chest Port 1 View  02/09/2014   CLINICAL DATA:  Respiratory failure  EXAM: PORTABLE CHEST - 1 VIEW  COMPARISON:  02/06/2014  FINDINGS: Stable tracheostomy tube and left PICC. Bibasilar airspace disease is improved. Left apical airspace disease is stable. No pneumothorax.  IMPRESSION: Improved bibasilar airspace disease. Stable left apical airspace disease.   Electronically Signed   By: Maryclare BeanArt  Hoss M.D.   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 Hx of OSA. Tobacco abuse. Plan: [facility] as [facility] Scheduled BDs F/u CXR intermittently  S/p tracheostomy with stomal bleeding/swelling around trach No hematoma.by ct 1/9 Plan: D/w Dr. Sharyon MedicusHijazi >> t CT neck 1/9 unremarkable ?ent consult  PE/DVT. Plan: Anti-coag as ordered  Hx of HTN, systolic CHF, CAD. Plan: Negative fluid balance as tolerated  Staph bacteremia. Varicella zoster. Plan: Per primary team    Brett CanalesSteve Minor ACNP [name] Pager [phone] till 3 pm If no answer page [phone]  Continue weaning, if bleeding recurs from trach site recommend involvement of ENT, will need further diureses.  Will continue to follow.  Patient seen and examined, agree with above note.  I dictated the care and orders written for this patient under my direction.  Alyson ReedyWesam G Yacoub, MD [phone]  02/09/2014, 10:17 AM

## 2014-02-10 ENCOUNTER — Other Ambulatory Visit (HOSPITAL_COMMUNITY): Payer: Medicare Other

## 2014-02-11 ENCOUNTER — Other Ambulatory Visit (HOSPITAL_COMMUNITY): Payer: Medicare Other

## 2014-02-11 DIAGNOSIS — J9611 Chronic respiratory failure with hypoxia: Secondary | ICD-10-CM

## 2014-02-11 LAB — BASIC METABOLIC PANEL
Anion gap: 3 — ABNORMAL LOW (ref 5–15)
BUN: 27 mg/dL — AB (ref 6–23)
CALCIUM: 9.1 mg/dL (ref 8.4–10.5)
CO2: 35 mmol/L — ABNORMAL HIGH (ref 19–32)
CREATININE: 0.53 mg/dL (ref 0.50–1.35)
Chloride: 96 mEq/L (ref 96–112)
GFR calc Af Amer: >90 mL/min (ref >90)
Glucose, Bld: 164 mg/dL — ABNORMAL HIGH (ref 70–99)
Potassium: 4.2 mmol/L (ref 3.5–5.1)
Sodium: 134 mmol/L — ABNORMAL LOW (ref 135–145)

## 2014-02-11 LAB — CULTURE, BLOOD (ROUTINE X 2)

## 2014-02-11 LAB — PHOSPHORUS: Phosphorus: 2.4 mg/dL (ref 2.3–4.6)

## 2014-02-11 NOTE — Progress Notes (Signed)
Name: Robert Mccarthy MRN: [id number] DOB: December 25, 1950    ADMISSION DATE:  02/07/2014 CONSULTATION DATE: 1/7  REFERRING MD : [facility]  CHIEF COMPLAINT:  FTT  BRIEF PATIENT DESCRIPTION:  64 yo male admitted [facility] 01/01/14 with AECOPD with VDRF and eventual tracheostomy, sepsis with Staph bacteremia, Varicella zoster, CHF, PE/DVT.  He was transferred to [facility] for vent weaning.  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 He further developed complications that included, sepsis from staph bacteremia, varicella zoster confirmed with tissue bx,  CHF, DVT, PE (on heparin till bleeding issues stopped heparin) taken to OR per ENT and trach cauterized. He has been evaluated by Palliative Care but family wants aggressive care so he was discharged to [facility] 02/26/2014 and PCCM asked to assist in his care. He is on peep of 8, tracheal site is +hematoma and he is refractory to weaning attempts.   SUBJECTIVE:  Not weaning 11/13 VITAL SIGNS: Vital signs reviewed. Abnormal values will appear under impression plan section.   PHYSICAL EXAMINATION: General:  Obese, white male, increased wob, uncomfortable, NSC, air hunger continues Neuro:  No follows commands, wd to pain HEENT:  Trach site better , decreased swelling still with packing around stoma, multiple missing teeth Cardiovascular:  HSD Lungs: Coarse rhonchi bilaterally Abdomen:  Peg, Obese. Disseminated rash better Musculoskeletal: Intact Skin: Lower extremities with chronic vascular changes            Recent Labs Lab 02/06/14 0500 02/09/14 0600 02/11/14 0500  NA 140 136 134*  K 4.3 4.7 4.2  CL 100 98 96  CO2 37* 34* 35*  BUN 32* 34* 27*  CREATININE 0.44* 0.53 0.53  GLUCOSE 166* 171* 164*    Recent Labs Lab 02/06/14 0500  02/09/14 0600  HGB 10.0* 9.9*  HCT 33.9* 33.3*  WBC 10.8* 13.9*  PLT 281 266   Dg Abd Portable 1v  02/10/2014   CLINICAL DATA:  Ileus.  Vomiting.  EXAM: PORTABLE ABDOMEN - 1 VIEW  COMPARISON:  02/09/2014.  FINDINGS: Persistent dilated loops of small bowel noted. The colon is nondistended. These findings suggest the possibility small bowel obstruction. No free air. Surgical clips over the pelvis. Degenerative changes lumbar spine.  IMPRESSION: Persistent dilated loops of small bowel. The colon does not appear distended. These findings suggest the possibility of small bowel obstruction. CT of the abdomen pelvis may prove useful for further evaluation.   Electronically Signed   By: Maisie Fus  Register   On: 02/10/2014 07:24   Dg Abd Portable 1v  02/10/2014   CLINICAL DATA:  Abdominal distention.  EXAM: PORTABLE ABDOMEN - 1 VIEW  COMPARISON:  CT 01/31/2014  FINDINGS: The stomach is markedly distended with air. There is diffuse gaseous distention of included bowel loops. The right and lower pelvis are not included in the field of view. No evidence of pneumatosis or free air of the included portion.  IMPRESSION: Gaseous distention of small bowel loops and marked gaseous gastric distention. Findings may reflect ileus versus developing small bowel obstruction. Continued radiographic follow-up recommended.   Electronically Signed   By: Rubye Oaks M.D.   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 Hx of OSA. Tobacco abuse. Plan: Vent [facility]an  [facility] 1[facility]13, not currently weanable) Scheduled BDs F/u CXR intermittently  S/p tracheostomy with stomal bleeding/swelling around trach No hematoma.by ct 1/9 Plan: D/w Dr. Sharyon MedicusHijazi >>  CT neck 1/9 unremarkable ?ent consult if bleeding returns  PE/DVT. Plan: Anti-coag as ordered  Hx of HTN, systolic CHF, CAD. Plan: Negative fluid balance as tolerated  Staph bacteremia. Varicella  zoster. Plan: Per primary team  We will see again on 1/18  [facility] Minor ACNP [name] Pager [phone] till 3 pm If no answer page [phone]  Failing weaning, I do not think patient is weanable at this point.  Would more than likely recommend a vent SNF if continues to fail over the next couple of days.  Patient seen and examined, agree with above note.  I dictated the care and orders written for this patient under my direction.  Alyson ReedyWesam G Yacoub, MD [phone]

## 2014-02-12 ENCOUNTER — Other Ambulatory Visit (HOSPITAL_COMMUNITY): Payer: Medicare Other

## 2014-02-12 LAB — CULTURE, RESPIRATORY W GRAM STAIN

## 2014-02-12 LAB — CBC
HCT: 30.5 % — ABNORMAL LOW (ref 39.0–52.0)
Hemoglobin: 9.7 g/dL — ABNORMAL LOW (ref 13.0–17.0)
MCH: 31 pg (ref 26.0–34.0)
MCHC: 31.8 g/dL (ref 30.0–36.0)
MCV: 97.4 fL (ref 78.0–100.0)
PLATELETS: 225 10*3/uL (ref 150–400)
RBC: 3.13 MIL/uL — ABNORMAL LOW (ref 4.22–5.81)
RDW: 15.8 % — ABNORMAL HIGH (ref 11.5–15.5)
WBC: 12.4 10*3/uL — AB (ref 4.0–10.5)

## 2014-02-12 LAB — CULTURE, RESPIRATORY: SPECIAL REQUESTS: NORMAL

## 2014-02-13 ENCOUNTER — Ambulatory Visit: Payer: Medicare Other | Admitting: Cardiovascular Disease

## 2014-02-13 ENCOUNTER — Encounter: Payer: Self-pay | Admitting: *Deleted

## 2014-02-13 ENCOUNTER — Other Ambulatory Visit (HOSPITAL_COMMUNITY): Payer: Medicare Other

## 2014-02-14 LAB — BASIC METABOLIC PANEL
ANION GAP: 1 — AB (ref 5–15)
BUN: 14 mg/dL (ref 6–23)
CO2: 34 mmol/L — ABNORMAL HIGH (ref 19–32)
Calcium: 8.6 mg/dL (ref 8.4–10.5)
Chloride: 97 mEq/L (ref 96–112)
Creatinine, Ser: 0.35 mg/dL — ABNORMAL LOW (ref 0.50–1.35)
GFR calc Af Amer: >90 mL/min (ref >90)
GFR calc non Af Amer: >90 mL/min (ref >90)
GLUCOSE: 171 mg/dL — AB (ref 70–99)
POTASSIUM: 3.8 mmol/L (ref 3.5–5.1)
SODIUM: 132 mmol/L — AB (ref 135–145)

## 2014-02-14 LAB — CBC
HCT: 29 % — ABNORMAL LOW (ref 39.0–52.0)
Hemoglobin: 9.3 g/dL — ABNORMAL LOW (ref 13.0–17.0)
MCH: 30 pg (ref 26.0–34.0)
MCHC: 32.1 g/dL (ref 30.0–36.0)
MCV: 93.5 fL (ref 78.0–100.0)
Platelets: 253 10*3/uL (ref 150–400)
RBC: 3.1 MIL/uL — ABNORMAL LOW (ref 4.22–5.81)
RDW: 15.4 % (ref 11.5–15.5)
WBC: 11.4 10*3/uL — ABNORMAL HIGH (ref 4.0–10.5)

## 2014-02-14 LAB — CULTURE, BLOOD (ROUTINE X 2): Culture: NO GROWTH

## 2014-02-16 DIAGNOSIS — Z9911 Dependence on respirator [ventilator] status: Secondary | ICD-10-CM

## 2014-02-16 DIAGNOSIS — G4733 Obstructive sleep apnea (adult) (pediatric): Secondary | ICD-10-CM

## 2014-02-16 LAB — CBC WITH DIFFERENTIAL/PLATELET
BASOS ABS: 0.1 10*3/uL (ref 0.0–0.1)
Basophils Relative: 1 % (ref 0–1)
Eosinophils Absolute: 1.1 10*3/uL — ABNORMAL HIGH (ref 0.0–0.7)
Eosinophils Relative: 9 % — ABNORMAL HIGH (ref 0–5)
HCT: 30.8 % — ABNORMAL LOW (ref 39.0–52.0)
HEMOGLOBIN: 9.8 g/dL — AB (ref 13.0–17.0)
Lymphocytes Relative: 14 % (ref 12–46)
Lymphs Abs: 1.7 10*3/uL (ref 0.7–4.0)
MCH: 29.8 pg (ref 26.0–34.0)
MCHC: 31.8 g/dL (ref 30.0–36.0)
MCV: 93.6 fL (ref 78.0–100.0)
MONO ABS: 0.7 10*3/uL (ref 0.1–1.0)
MONOS PCT: 6 % (ref 3–12)
NEUTROS ABS: 8.4 10*3/uL — AB (ref 1.7–7.7)
NEUTROS PCT: 70 % (ref 43–77)
Platelets: 293 10*3/uL (ref 150–400)
RBC: 3.29 MIL/uL — ABNORMAL LOW (ref 4.22–5.81)
RDW: 15.5 % (ref 11.5–15.5)
WBC: 12 10*3/uL — ABNORMAL HIGH (ref 4.0–10.5)

## 2014-02-16 NOTE — Progress Notes (Signed)
   Name: Robert Mccarthy MRN: [id number] DOB: [birth year]    ADMISSION DATE:  02/16/2014 CONSULTATION DATE: 1/7  [facility]  CHIEF COMPLAINT:  FTT  BRIEF PATIENT DESCRIPTION:  64 [facility] with AECOPD with VDRF and eventual tracheostomy, sepsis with Staph bacteremia, Varicella zoster, CHF, PE/DVT.  [facility]  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 He further developed complications that included, sepsis from staph bacteremia, varicella zoster confirmed with tissue bx,  CHF, DVT, PE (on heparin till bleeding issues stopped heparin) taken to OR per ENT and trach cauterized. He has been evaluated by Palliative Care but family wants aggressive care so [facility] and PCCM asked to assist in his care. He is on peep of 8, tracheal site is +hematoma and he is refractory to weaning attempts.   SUBJECTIVE:  Not weaning 11/13 VITAL SIGNS: Vital signs reviewed. Abnormal values will appear under impression plan section.   PHYSICAL EXAMINATION: General:  Obese, white male, awake but not interactive Neuro:  No follows commands, wd to pain HEENT:  Trach site better , decreased swelling still with packing around stoma, multiple missing teeth Cardiovascular:  HSD Lungs: Coarse rhonchi bilaterally Abdomen:  Peg, Obese. Disseminated rash better Musculoskeletal: Intact Skin: Lower extremities with chronic vascular changes   Recent Labs Lab 02/11/14 0500 02/14/14 0700  NA 134* 132*  K 4.2 3.8  CL 96 97  CO2 35* 34*  BUN 27* 14  CREATININE 0.53 0.35*  GLUCOSE 164* 171*    Recent Labs Lab 02/12/14 0420 02/14/14 0700 02/16/14 0500  HGB 9.7* 9.3* 9.8*  HCT 30.5* 29.0* 30.8*  WBC 12.4* 11.4* 12.0*   PLT 225 253 293   No results found.  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 Hx of OSA. Tobacco abuse. PE/DVT. Hx of HTN, systolic CHF, CAD. Staph bacteremia. Varicella zoster.  Discussion Discussed at length during pulmonary rounds. He continues to fail weaning efforts. Suspect this is at least some extent driven by his neurological injuries. Not sure at this point how much he will improve. Little to add other than time. Suspect he will need Vent/SNF   Plan: [facility] as [facility] Scheduled BDs F/u CXR intermittently Anti-coag as ordered Negative fluid balance as tolerated abx Per primary team  Simonne MartinetPeter E Babcock ACNP-BC [facility] Pager # [phone] OR # [phone] if no answer  Currently failing weaning.  This is due to deconditioning as well as neurological injury.  I see no reasonable chance for him to successfully be liberated from the vent.  Would recommend search for a vent SNF at this point.  Continue diureses in the meantime.  Continue treatment with abx and anti-coag as ordered.  Patient seen and examined, agree with above note.  I dictated the care and orders written for this patient under my direction.  Alyson ReedyWesam G Yacoub, MD [phone]

## 2014-02-19 ENCOUNTER — Other Ambulatory Visit (HOSPITAL_COMMUNITY): Payer: Medicare Other

## 2014-02-19 NOTE — Progress Notes (Signed)
   Name: Robert Mccarthy MRN: [id number] DOB: 06-13-1950    ADMISSION DATE:  02/06/2014 CONSULTATION DATE: 1/7  [facility]  CHIEF COMPLAINT:  FTT  BRIEF PATIENT DESCRIPTION:  64 [facility] with AECOPD with VDRF and eventual tracheostomy, sepsis with Staph bacteremia, Varicella zoster, CHF, PE/DVT.  [facility].  SIGNIFICANT EVENTS  1/7 tx to ssh  STUDIES:  1/8 CT neck >>NAD   SUBJECTIVE:  Not weaning 11/13 VITAL SIGNS: Vital signs reviewed. Abnormal values will appear under impression plan section.   PHYSICAL EXAMINATION: General:  Obese, white male, awake now more interactive Neuro:  No follows commands, wd to pain HEENT:  Trach site better , decreased swelling still with packing around stoma, multiple missing teeth Cardiovascular:  HSD Lungs: Coarse rhonchi bilaterally w/ good air movement Abdomen:  Peg, Obese. Disseminated rash better Musculoskeletal: Intact Skin: Lower extremities with chronic vascular changes   Recent Labs Lab 02/14/14 0700  NA 132*  K 3.8  CL 97  CO2 34*  BUN 14  CREATININE 0.35*  GLUCOSE 171*    Recent Labs Lab 02/14/14 0700 02/16/14 0500  HGB 9.3* 9.8*  HCT 29.0* 30.8*  WBC 11.4* 12.0*  PLT 253 293   No results found.  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 Citrobacter and stenotrophomonas tracheobronchitis  Hx of OSA. Tobacco abuse. Subacute encephalitis w/ ongoing delirium  PE/DVT. Hx of HTN, systolic CHF, CAD. Staph bacteremia. Varicella zoster.  Discussion He is on ATC today. His residual encephalopathy is his largest barrier to pulmonary progression.    Plan: Wean protocol per ssh Scheduled BDs F/u CXR intermittently Anti-coag as ordered Negative fluid balance as tolerated abx Per primary team (cefepime and bactrim)  Simonne MartinetPeter E Babcock ACNP-BC [facility] Pager # [phone] OR # [phone] if no  answer    Simonne MartinetPeter E Babcock, NP [phone]  STAFF NOTE: I, Robert Percyaniel Nadav Swindell, MD FACP have personally reviewed patient's available data, including medical history, events of note, physical examination and test results as part of my evaluation. I have discussed with resident/NP and other care providers such as pharmacist, RN and RRT. In addition, I personally evaluated patient and elicited key findings of: needs continud neg balance, weaning today TC 2.25 hours, would continue to have slow weaning progress, TC goal to 4 hrs in am  Per protocol, crackles on exam   Robert RossettiDaniel J. Tyson AliasFeinstein, MD, FACP Pgr: [phone] [facility] 02/20/2014 12:27 PM

## 2014-02-20 DIAGNOSIS — R140 Abdominal distension (gaseous): Secondary | ICD-10-CM

## 2014-02-20 DIAGNOSIS — J438 Other emphysema: Secondary | ICD-10-CM

## 2014-02-22 LAB — BASIC METABOLIC PANEL
Anion gap: 7 (ref 5–15)
BUN: 15 mg/dL (ref 6–23)
CALCIUM: 9.6 mg/dL (ref 8.4–10.5)
CHLORIDE: 91 mmol/L — AB (ref 96–112)
CO2: 35 mmol/L — ABNORMAL HIGH (ref 19–32)
Creatinine, Ser: 0.45 mg/dL — ABNORMAL LOW (ref 0.50–1.35)
GFR calc Af Amer: >90 mL/min (ref >90)
GFR calc non Af Amer: >90 mL/min (ref >90)
GLUCOSE: 172 mg/dL — AB (ref 70–99)
Potassium: 4.3 mmol/L (ref 3.5–5.1)
SODIUM: 133 mmol/L — AB (ref 135–145)

## 2014-02-22 LAB — CBC
HCT: 31.5 % — ABNORMAL LOW (ref 39.0–52.0)
Hemoglobin: 9.7 g/dL — ABNORMAL LOW (ref 13.0–17.0)
MCH: 29.6 pg (ref 26.0–34.0)
MCHC: 30.8 g/dL (ref 30.0–36.0)
MCV: 96 fL (ref 78.0–100.0)
PLATELETS: 209 10*3/uL (ref 150–400)
RBC: 3.28 MIL/uL — AB (ref 4.22–5.81)
RDW: 16.4 % — AB (ref 11.5–15.5)
WBC: 14.9 10*3/uL — AB (ref 4.0–10.5)

## 2014-02-23 NOTE — Progress Notes (Signed)
   Name: Robert Mccarthy MRN: [id number] DOB: 1950/09/22    ADMISSION DATE:  03/01/2014 CONSULTATION DATE: 1/7  [facility] : [facility]  CHIEF COMPLAINT:  FTT  BRIEF PATIENT DESCRIPTION:  64 [facility] with AECOPD with VDRF and eventual tracheostomy, sepsis with Staph bacteremia, Varicella zoster, CHF, PE/DVT.  [facility]  SIGNIFICANT EVENTS  1/7 tx to ssh  STUDIES:  1/8 CT neck >>NAD   SUBJECTIVE:  On t collar VITAL SIGNS: Vital signs reviewed. Abnormal values will appear under impression plan section.   PHYSICAL EXAMINATION: General:  Obese, white male, awake  interactive Neuro:  No follows commands, wd to pain HEENT:  Trach site better , decreased swelling still with packing around stoma, multiple missing teeth Cardiovascular:  HSD Lungs: Coarse rhonchi bilaterally w/ good air movement Abdomen:  Peg, Obese. Disseminated rash better Musculoskeletal: Intact Skin: Lower extremities with chronic vascular changes   Recent Labs Lab 02/22/14 0550  NA 133*  K 4.3  CL 91*  CO2 35*  BUN 15  CREATININE 0.45*  GLUCOSE 172*    Recent Labs Lab 02/22/14 0550  HGB 9.7*  HCT 31.5*  WBC 14.9*  PLT 209   No results found.  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 Citrobacter and stenotrophomonas tracheobronchitis  Hx of OSA. Tobacco abuse. Subacute encephalitis w/ ongoing delirium  PE/DVT. Hx of HTN, systolic CHF, CAD. Staph bacteremia. Varicella zoster.  Discussion He is on ATC today. His residual encephalopathy is his largest barrier to pulmonary progression.    Plan: Wean protocol per ssh Scheduled BDs F/u CXR intermittently Anti-coag as ordered Negative fluid balance as tolerated abx Per primary team   Brett CanalesSteve Minor ACNP [name] Pager [phone] till 3 pm If no answer page [phone]  Tolerating TC, recommend continuing to push for longer times, however, it is  important to note that his mental status precludes him from decannulation and overtime will be a cause for his respiratory failure to continue.  Patient seen and examined, agree with above note.  I dictated the care and orders written for this patient under my direction.  Alyson ReedyWesam G Valkyrie Guardiola, MD [phone]  02/23/2014, 9:50 AM

## 2014-02-25 NOTE — Progress Notes (Signed)
   Name: Robert Mccarthy MRN: [id number] DOB: 04/07/1950    ADMISSION DATE:  02/25/2014 CONSULTATION DATE: 1/7  [facility] : [facility]  CHIEF COMPLAINT:  FTT  BRIEF PATIENT DESCRIPTION:  64 [facility] with AECOPD with VDRF and eventual tracheostomy, sepsis with Staph bacteremia, Varicella zoster, CHF, PE/DVT.  [facility]  SIGNIFICANT EVENTS  1/7 tx to ssh  STUDIES:  1/8 CT neck >>NAD   SUBJECTIVE:  On t collar VITAL SIGNS: Vital signs reviewed. Abnormal values will appear under impression plan section.   PHYSICAL EXAMINATION: General:  Obese, white male, awake  interactive Neuro:  No follows commands, wd to pain HEENT:  Trach site better , decreased swelling still with packing around stoma, multiple missing teeth Cardiovascular:  HSD Lungs: Coarse rhonchi bilaterally w/ good air movement Abdomen:  Peg, Obese. Disseminated rash better Musculoskeletal: Intact Skin: Lower extremities with chronic vascular changes   Recent Labs Lab 02/22/14 0550  NA 133*  K 4.3  CL 91*  CO2 35*  BUN 15  CREATININE 0.45*  GLUCOSE 172*    Recent Labs Lab 02/22/14 0550  HGB 9.7*  HCT 31.5*  WBC 14.9*  PLT 209   No results found.  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 Citrobacter and stenotrophomonas tracheobronchitis  Hx of OSA. Tobacco abuse. Subacute encephalitis w/ ongoing delirium  PE/DVT. Hx of HTN, systolic CHF, CAD. Staph bacteremia. Varicella zoster.  Discussion Currently on PS.    Plan: Wean protocol per ssh Scheduled BDs F/u CXR intermittently Anti-coag as ordered Negative fluid balance as tolerated abx Per primary team   Recommend continuation of PS trials.  Anticipate will move to TC in the next week.  Patient seen and examined, agree with above note.  I dictated the care and orders written for this patient under my direction.  Alyson ReedyWesam G Adilen Pavelko,  MD [phone]  02/25/2014, 2:11 PM

## 2014-03-01 ENCOUNTER — Other Ambulatory Visit (HOSPITAL_COMMUNITY): Payer: Medicare Other

## 2014-03-01 LAB — BASIC METABOLIC PANEL
Anion gap: 7 (ref 5–15)
Anion gap: 6 (ref 5–15)
BUN: 20 mg/dL (ref 6–23)
BUN: 18 mg/dL (ref 6–23)
CALCIUM: 9.6 mg/dL (ref 8.4–10.5)
CO2: 37 mmol/L — ABNORMAL HIGH (ref 19–32)
CO2: 39 mmol/L — ABNORMAL HIGH (ref 19–32)
CREATININE: 0.46 mg/dL — AB (ref 0.50–1.35)
Calcium: 9.2 mg/dL (ref 8.4–10.5)
Chloride: 88 mmol/L — ABNORMAL LOW (ref 96–112)
Chloride: 90 mmol/L — ABNORMAL LOW (ref 96–112)
Creatinine, Ser: 0.48 mg/dL — ABNORMAL LOW (ref 0.50–1.35)
GFR calc Af Amer: >90 mL/min (ref >90)
GFR calc non Af Amer: >90 mL/min (ref >90)
GFR calc non Af Amer: >90 mL/min (ref >90)
Glucose, Bld: 186 mg/dL — ABNORMAL HIGH (ref 70–99)
Glucose, Bld: 122 mg/dL — ABNORMAL HIGH (ref 70–99)
Potassium: 4.3 mmol/L (ref 3.5–5.1)
Potassium: 4.2 mmol/L (ref 3.5–5.1)
SODIUM: 135 mmol/L (ref 135–145)
Sodium: 132 mmol/L — ABNORMAL LOW (ref 135–145)

## 2014-03-01 LAB — URINALYSIS, ROUTINE W REFLEX MICROSCOPIC
BILIRUBIN URINE: NEGATIVE
GLUCOSE, UA: NEGATIVE mg/dL
Hgb urine dipstick: NEGATIVE
KETONES UR: NEGATIVE mg/dL
LEUKOCYTES UA: NEGATIVE
Nitrite: NEGATIVE
PH: 5 (ref 5.0–8.0)
Protein, ur: NEGATIVE mg/dL
Specific Gravity, Urine: 1.022 (ref 1.005–1.030)
Urobilinogen, UA: 1 mg/dL (ref 0.0–1.0)

## 2014-03-01 LAB — CBC WITH DIFFERENTIAL/PLATELET
BASOS PCT: 0 % (ref 0–1)
Basophils Absolute: 0 10*3/uL (ref 0.0–0.1)
EOS ABS: 0.2 10*3/uL (ref 0.0–0.7)
Eosinophils Relative: 1 % (ref 0–5)
HCT: 27.4 % — ABNORMAL LOW (ref 39.0–52.0)
Hemoglobin: 8.5 g/dL — ABNORMAL LOW (ref 13.0–17.0)
Lymphocytes Relative: 9 % — ABNORMAL LOW (ref 12–46)
Lymphs Abs: 1.5 10*3/uL (ref 0.7–4.0)
MCH: 30 pg (ref 26.0–34.0)
MCHC: 31 g/dL (ref 30.0–36.0)
MCV: 96.8 fL (ref 78.0–100.0)
MONO ABS: 1.4 10*3/uL — AB (ref 0.1–1.0)
MONOS PCT: 9 % (ref 3–12)
Neutro Abs: 13.6 10*3/uL — ABNORMAL HIGH (ref 1.7–7.7)
Neutrophils Relative %: 81 % — ABNORMAL HIGH (ref 43–77)
PLATELETS: 185 10*3/uL (ref 150–400)
RBC: 2.83 MIL/uL — AB (ref 4.22–5.81)
RDW: 16.4 % — ABNORMAL HIGH (ref 11.5–15.5)
WBC: 16.7 10*3/uL — ABNORMAL HIGH (ref 4.0–10.5)

## 2014-03-02 LAB — CULTURE, RESPIRATORY: GRAM STAIN: NONE SEEN

## 2014-03-02 LAB — CULTURE, RESPIRATORY W GRAM STAIN

## 2014-03-02 DEATH — deceased

## 2014-03-03 DIAGNOSIS — J9610 Chronic respiratory failure, unspecified whether with hypoxia or hypercapnia: Secondary | ICD-10-CM

## 2014-03-03 NOTE — Progress Notes (Signed)
   Name: Robert Mccarthy MRN: [id number] DOB: 07-24-1950    ADMISSION DATE:  02/17/2014 CONSULTATION DATE: 1/7  [facility] : [facility]  CHIEF COMPLAINT:  FTT  BRIEF PATIENT DESCRIPTION:   64 [facility] with AECOPD with VDRF and eventual tracheostomy, sepsis with Staph bacteremia, Varicella zoster, CHF, PE/DVT.  [facility].  SIGNIFICANT EVENTS  1/7  tx to ssh 2/2  Weaning on ATC 60%  STUDIES:  1/8 CT neck >>NAD   SUBJECTIVE:   RT reports pt weaning on ATC 60%, no acute events.       VITAL SIGNS: Vital signs reviewed. Abnormal values will appear under impression plan section.   PHYSICAL EXAMINATION: General:  Obese, white male, awake  Neuro:  Alert but no follows commands, moves ext's spontaneously HEENT:  Trach midline c/d,  multiple missing teeth Cardiovascular:  s1s2 rrr Lungs: Coarse rhonchi bilaterally w/ good air movement Abdomen:  PEG c/d, Obese.  Musculoskeletal: Intact Skin: Lower extremities with chronic vascular changes   Recent Labs Lab 03/01/14 0800 03/01/14 1615  NA 132* 135  K 4.3 4.2  CL 88* 90*  CO2 37* 39*  BUN 20 18  CREATININE 0.48* 0.46*  GLUCOSE 186* 122*    Recent Labs Lab 03/01/14 1615  HGB 8.5*  HCT 27.4*  WBC 16.7*  PLT 185   Dg Chest Port 1 View  03/01/2014   CLINICAL DATA:  Fever.  EXAM: PORTABLE CHEST - 1 VIEW  COMPARISON:  Single view of the chest 02/19/2014 and 02/12/2014.  FINDINGS: Tracheostomy tube and left PICC remain in place. There has been worsening of airspace disease in the left lung apex. Milder degree of airspace opacity in the lung bases, more notable on the right, is unchanged. Heart size is enlarged. No pneumothorax.  IMPRESSION: Marked worsening and left upper lobe airspace disease compatible with pneumonia. Basilar airspace disease, more notable on the right is not appear notably change.   Electronically Signed   By: Drusilla Kannerhomas  Dalessio M.D.   On: 03/01/2014 17:26     ASSESSMENT   Acute on Chronic Respiratory Failure 2nd to AECOPD. Citrobacter and stenotrophomonas tracheobronchitis  Hx of OSA. Tobacco abuse. Subacute encephalitis w/ ongoing delirium  PE/DVT. Hx of HTN, systolic CHF, CAD. Staph bacteremia. Varicella zoster.  Discussion:  Pt tolerating ATC with 60% fiO2.  Concern he may not tolerate for extended periods with high O2 requirements.     [facility]: [facility]  Mobilize / PT efforts Scheduled BDs F/u CXR intermittently Anti-coag per primary SVC Diuresis as renal function / BP permit ABX Per primary team  Would not consider decannulation unless he is more mobile.      Canary BrimBrandi Ollis, NP-C [facility] Pgr: [phone] or [phone]  Billy Fischeravid Simonds, MD ; [facility] service Mobile [phone].  After 5:30 PM or weekends, call [phone]  03/03/2014, 8:36 AM

## 2014-03-04 ENCOUNTER — Other Ambulatory Visit (HOSPITAL_COMMUNITY): Payer: Medicare Other

## 2014-03-04 LAB — URINALYSIS, ROUTINE W REFLEX MICROSCOPIC
Bilirubin Urine: NEGATIVE
Glucose, UA: NEGATIVE mg/dL
Hgb urine dipstick: NEGATIVE
Ketones, ur: NEGATIVE mg/dL
NITRITE: NEGATIVE
PH: 5 (ref 5.0–8.0)
Protein, ur: NEGATIVE mg/dL
Specific Gravity, Urine: 1.02 (ref 1.005–1.030)
Urobilinogen, UA: 1 mg/dL (ref 0.0–1.0)

## 2014-03-04 LAB — PROCALCITONIN: Procalcitonin: 0.44 ng/mL

## 2014-03-04 LAB — CBC
HCT: 26.5 % — ABNORMAL LOW (ref 39.0–52.0)
HEMOGLOBIN: 8.1 g/dL — AB (ref 13.0–17.0)
MCH: 30 pg (ref 26.0–34.0)
MCHC: 30.6 g/dL (ref 30.0–36.0)
MCV: 98.1 fL (ref 78.0–100.0)
PLATELETS: 311 10*3/uL (ref 150–400)
RBC: 2.7 MIL/uL — ABNORMAL LOW (ref 4.22–5.81)
RDW: 16.2 % — ABNORMAL HIGH (ref 11.5–15.5)
WBC: 20.8 10*3/uL — ABNORMAL HIGH (ref 4.0–10.5)

## 2014-03-04 LAB — BASIC METABOLIC PANEL
ANION GAP: 8 (ref 5–15)
BUN: 26 mg/dL — AB (ref 6–23)
CO2: 37 mmol/L — ABNORMAL HIGH (ref 19–32)
Calcium: 9.3 mg/dL (ref 8.4–10.5)
Chloride: 90 mmol/L — ABNORMAL LOW (ref 96–112)
Creatinine, Ser: 0.6 mg/dL (ref 0.50–1.35)
GFR calc non Af Amer: >90 mL/min (ref >90)
GLUCOSE: 198 mg/dL — AB (ref 70–99)
Potassium: 4.1 mmol/L (ref 3.5–5.1)
SODIUM: 135 mmol/L (ref 135–145)

## 2014-03-04 LAB — GRAM STAIN: SPECIAL REQUESTS: NORMAL

## 2014-03-04 LAB — VANCOMYCIN, TROUGH: Vancomycin Tr: 9.1 ug/mL — ABNORMAL LOW (ref 10.0–20.0)

## 2014-03-04 LAB — URINE MICROSCOPIC-ADD ON

## 2014-03-05 LAB — URINE CULTURE
Colony Count: NO GROWTH
Culture: NO GROWTH
SPECIAL REQUESTS: NORMAL

## 2014-03-06 LAB — PROCALCITONIN: Procalcitonin: 0.29 ng/mL

## 2014-03-08 LAB — PROCALCITONIN: Procalcitonin: <0.1 ng/mL

## 2014-03-09 ENCOUNTER — Other Ambulatory Visit (HOSPITAL_COMMUNITY): Payer: Medicare Other

## 2014-03-09 DIAGNOSIS — R509 Fever, unspecified: Secondary | ICD-10-CM

## 2014-03-09 LAB — BLOOD GAS, ARTERIAL
Acid-Base Excess: 16.7 mmol/L — ABNORMAL HIGH (ref 0.0–2.0)
BICARBONATE: 41.8 meq/L — AB (ref 20.0–24.0)
FIO2: 0.4 %
MECHVT: 550 mL
O2 SAT: 92.3 %
PEEP: 5 cmH2O
PH ART: 7.466 — AB (ref 7.350–7.450)
Patient temperature: 98.1
RATE: 20 resp/min
TCO2: 43.6 mmol/L (ref 0–100)
pCO2 arterial: 58.5 mmHg (ref 35.0–45.0)
pO2, Arterial: 65.7 mmHg — ABNORMAL LOW (ref 80.0–100.0)

## 2014-03-09 LAB — CBC WITH DIFFERENTIAL/PLATELET
BASOS ABS: 0 10*3/uL (ref 0.0–0.1)
Basophils Relative: 0 % (ref 0–1)
Eosinophils Absolute: 0 10*3/uL (ref 0.0–0.7)
Eosinophils Relative: 0 % (ref 0–5)
HEMATOCRIT: 25.6 % — AB (ref 39.0–52.0)
Hemoglobin: 7.7 g/dL — ABNORMAL LOW (ref 13.0–17.0)
LYMPHS ABS: 1.3 10*3/uL (ref 0.7–4.0)
LYMPHS PCT: 8 % — AB (ref 12–46)
MCH: 29.8 pg (ref 26.0–34.0)
MCHC: 30.1 g/dL (ref 30.0–36.0)
MCV: 99.2 fL (ref 78.0–100.0)
MONOS PCT: 4 % (ref 3–12)
Monocytes Absolute: 0.7 10*3/uL (ref 0.1–1.0)
NEUTROS ABS: 14.6 10*3/uL — AB (ref 1.7–7.7)
Neutrophils Relative %: 88 % — ABNORMAL HIGH (ref 43–77)
PLATELETS: 459 10*3/uL — AB (ref 150–400)
RBC: 2.58 MIL/uL — AB (ref 4.22–5.81)
RDW: 16.6 % — ABNORMAL HIGH (ref 11.5–15.5)
WBC: 16.6 10*3/uL — ABNORMAL HIGH (ref 4.0–10.5)

## 2014-03-09 LAB — URINALYSIS, ROUTINE W REFLEX MICROSCOPIC
BILIRUBIN URINE: NEGATIVE
Glucose, UA: NEGATIVE mg/dL
Hgb urine dipstick: NEGATIVE
Ketones, ur: NEGATIVE mg/dL
Leukocytes, UA: NEGATIVE
Nitrite: NEGATIVE
PROTEIN: NEGATIVE mg/dL
Specific Gravity, Urine: 1.012 (ref 1.005–1.030)
UROBILINOGEN UA: 1 mg/dL (ref 0.0–1.0)
pH: 7 (ref 5.0–8.0)

## 2014-03-09 LAB — BASIC METABOLIC PANEL
Anion gap: 6 (ref 5–15)
BUN: 29 mg/dL — AB (ref 6–23)
CO2: 43 mmol/L (ref 19–32)
Calcium: 9.5 mg/dL (ref 8.4–10.5)
Chloride: 94 mmol/L — ABNORMAL LOW (ref 96–112)
Creatinine, Ser: 0.61 mg/dL (ref 0.50–1.35)
GFR calc non Af Amer: >90 mL/min (ref >90)
Glucose, Bld: 293 mg/dL — ABNORMAL HIGH (ref 70–99)
Potassium: 4.1 mmol/L (ref 3.5–5.1)
SODIUM: 143 mmol/L (ref 135–145)

## 2014-03-09 LAB — PROCALCITONIN: Procalcitonin: 0.1 ng/mL

## 2014-03-09 LAB — SEDIMENTATION RATE: Sed Rate: 130 mm/hr — ABNORMAL HIGH (ref 0–16)

## 2014-03-09 NOTE — Progress Notes (Signed)
Name: Robert Mccarthy MRN: [id number] DOB: 07/26/1950    ADMISSION DATE:  02/20/2014 CONSULTATION DATE: 1/7  REFERRING MD : [facility]  CHIEF COMPLAINT:  FTT  BRIEF PATIENT DESCRIPTION:   64 y/o male admitted to [facility] 01/01/14 with AECOPD with VDRF and eventual tracheostomy, sepsis with Staph bacteremia, Varicella zoster, CHF, PE/DVT.  He was transferred to [facility] for vent weaning.  SIGNIFICANT EVENTS  1/7  tx to ssh 2/2  Weaning on ATC 60%  STUDIES:  CT neck 1/9 >>NAD CXR 2/8 >>> persistent LUL and bibasilar pulm infiltrates.  Cavitation in LUL can not be excluded.   SUBJECTIVE:   Has not been able to continue with weaning due to secretions and pt appearing uncomfortable.  Has had persistent temp (Tmax 103.1 on 2/8).     VITAL SIGNS:  Febrile to 103.1, intermittently tachycardic.  Otherwise VSS.  PHYSICAL EXAMINATION: General:  Obese, white male, awake  Neuro:  Alert, moves ext's spontaneously HEENT:  Trach C/D/I. Cardiovascular:  RRR, no M/R/G. Lungs: Decreased BS bilaterally.  No W/R/R. Abdomen:  PEG C/D/I, Obese.  Musculoskeletal: No deformities, no edema. Skin: Lower extremities with chronic vascular changes.  Recent Labs Lab 03/04/14 1500  NA 135  K 4.1  CL 90*  CO2 37*  BUN 26*  CREATININE 0.60  GLUCOSE 198*    Recent Labs Lab 03/04/14 1500  HGB 8.1*  HCT 26.5*  WBC 20.8*  PLT 311   Dg Chest Port 1 View  03/09/2014   CLINICAL DATA:  Pneumonia.  EXAM: PORTABLE CHEST - 1 VIEW  COMPARISON:  None.  FINDINGS: Tracheostomy tube noted good anatomic position. Left PICC line in good anatomic position. Persistent left upper and bilateral lower lobe infiltrates consistent with multifocal pneumonia. Cavitary changes in the left apex cannot be excluded. Cardiomegaly with normal pulmonary vascularity. No pneumothorax. Left hemidiaphragm not imaged. No acute osseous abnormality .  IMPRESSION: Persistent left upper lobe and bibasilar pulmonary infiltrates. Cavitation in the  left upper lobe infiltrate cannot be excluded.   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  Question whether this could represent drug fever vs ongoing / worsening infection ? ? LUL cavitation - not overly impressed on CXR Hx of OSA PE/DVT Tobacco abuse Hx of HTN, systolic CHF, CAD Varicella zoster Subacute encephalitis w/ ongoing delirium  Discussion:  Pt not progressing with weaning due to secretions and discomfort.  Has persistent fevers; though, note WBC increased and PCT decreased (< 0.10).  Has been on multiple abx which according to sensitivity reports are appropriate.  This brings up the question whether this could be drug related fever ?   Plan: Continue weaning per [facility] protocol. Continue DuoNebs, Albuterol. Abx and current micro data reviewed.  Would changed ceftriaxone to ceftaz, continue zyvox, d/c bactrim. Would hold off on CT / further imaging for now. F/u CXR intermittently. Anti-coag per primary team. Diuresis as renal function / BP permit. Would not consider decannulation unless he is more mobile.     Rutherford Guys, PA - [facility] [place] [facility] Pgr: [phone]  or [phone]  Unclear cause of persistent fever, leukocytosis increasing for pro-calcitonin normalised - this raises the question of drug fever. He is being treated for MRSA  pneumonia, respiratory cultures on 2/10 also showed stenotrophomonas-sensitive to Bactrim, and Citrobacter that was sensitive to ceftriaxone and ceftazidime.  Wonder if we have to consider a new vap or drug fever. Would use ceftazidime instead of ceftriaxone-panculture again, and consult ID. No new effusions are present on chest x-ray-there is concern for some  cavitation-if persistent, then can consider CT imaging of chest. Consider culturing urine and changing lines  Quintrell Baze V. md 03/09/2014, 12:09 PM

## 2014-03-10 LAB — CULTURE, BLOOD (ROUTINE X 2)
Culture: NO GROWTH
Culture: NO GROWTH

## 2014-03-10 LAB — URINE CULTURE
COLONY COUNT: NO GROWTH
Culture: NO GROWTH
SPECIAL REQUESTS: NORMAL

## 2014-03-10 LAB — C-REACTIVE PROTEIN: CRP: 11 mg/dL — ABNORMAL HIGH (ref <0.60)

## 2014-03-11 ENCOUNTER — Other Ambulatory Visit (HOSPITAL_COMMUNITY): Payer: Medicare Other

## 2014-03-11 LAB — CBC WITH DIFFERENTIAL/PLATELET
BASOS ABS: 0 10*3/uL (ref 0.0–0.1)
BASOS PCT: 0 % (ref 0–1)
Eosinophils Absolute: 0 10*3/uL (ref 0.0–0.7)
Eosinophils Relative: 0 % (ref 0–5)
HEMATOCRIT: 27.6 % — AB (ref 39.0–52.0)
HEMOGLOBIN: 8.1 g/dL — AB (ref 13.0–17.0)
Lymphocytes Relative: 9 % — ABNORMAL LOW (ref 12–46)
Lymphs Abs: 1.7 10*3/uL (ref 0.7–4.0)
MCH: 29.5 pg (ref 26.0–34.0)
MCHC: 29.3 g/dL — AB (ref 30.0–36.0)
MCV: 100.4 fL — ABNORMAL HIGH (ref 78.0–100.0)
MONO ABS: 0.6 10*3/uL (ref 0.1–1.0)
Monocytes Relative: 3 % (ref 3–12)
NEUTROS PCT: 88 % — AB (ref 43–77)
Neutro Abs: 16.7 10*3/uL — ABNORMAL HIGH (ref 1.7–7.7)
Platelets: 401 10*3/uL — ABNORMAL HIGH (ref 150–400)
RBC: 2.75 MIL/uL — AB (ref 4.22–5.81)
RDW: 17.2 % — ABNORMAL HIGH (ref 11.5–15.5)
WBC: 19 10*3/uL — ABNORMAL HIGH (ref 4.0–10.5)

## 2014-03-11 LAB — BASIC METABOLIC PANEL
Anion gap: 10 (ref 5–15)
BUN: 28 mg/dL — ABNORMAL HIGH (ref 6–23)
CO2: 39 mmol/L — ABNORMAL HIGH (ref 19–32)
Calcium: 9.5 mg/dL (ref 8.4–10.5)
Chloride: 95 mmol/L — ABNORMAL LOW (ref 96–112)
Creatinine, Ser: 0.65 mg/dL (ref 0.50–1.35)
GFR calc non Af Amer: >90 mL/min (ref >90)
GLUCOSE: 372 mg/dL — AB (ref 70–99)
POTASSIUM: 3.3 mmol/L — AB (ref 3.5–5.1)
SODIUM: 144 mmol/L (ref 135–145)

## 2014-03-11 LAB — CULTURE, BLOOD (SINGLE): CULTURE: NO GROWTH

## 2014-03-11 MED ORDER — IOHEXOL 300 MG/ML  SOLN
100.0000 mL | Freq: Once | INTRAMUSCULAR | Status: AC | PRN
  Administered 2014-03-11: 100 mL via INTRAVENOUS

## 2014-03-12 LAB — BASIC METABOLIC PANEL
ANION GAP: 5 (ref 5–15)
BUN: 28 mg/dL — ABNORMAL HIGH (ref 6–23)
CHLORIDE: 96 mmol/L (ref 96–112)
CO2: 43 mmol/L — AB (ref 19–32)
Calcium: 9.2 mg/dL (ref 8.4–10.5)
Creatinine, Ser: 0.48 mg/dL — ABNORMAL LOW (ref 0.50–1.35)
GLUCOSE: 384 mg/dL — AB (ref 70–99)
POTASSIUM: 3 mmol/L — AB (ref 3.5–5.1)
SODIUM: 144 mmol/L (ref 135–145)

## 2014-03-12 LAB — CBC WITH DIFFERENTIAL/PLATELET
Basophils Absolute: 0 10*3/uL (ref 0.0–0.1)
Basophils Relative: 0 % (ref 0–1)
EOS PCT: 0 % (ref 0–5)
Eosinophils Absolute: 0 10*3/uL (ref 0.0–0.7)
HCT: 27.5 % — ABNORMAL LOW (ref 39.0–52.0)
HEMOGLOBIN: 8.1 g/dL — AB (ref 13.0–17.0)
Lymphocytes Relative: 7 % — ABNORMAL LOW (ref 12–46)
Lymphs Abs: 1.2 10*3/uL (ref 0.7–4.0)
MCH: 30.1 pg (ref 26.0–34.0)
MCHC: 29.5 g/dL — ABNORMAL LOW (ref 30.0–36.0)
MCV: 102.2 fL — AB (ref 78.0–100.0)
MONOS PCT: 2 % — AB (ref 3–12)
Monocytes Absolute: 0.3 10*3/uL (ref 0.1–1.0)
Neutro Abs: 14.4 10*3/uL — ABNORMAL HIGH (ref 1.7–7.7)
Neutrophils Relative %: 91 % — ABNORMAL HIGH (ref 43–77)
PLATELETS: 337 10*3/uL (ref 150–400)
RBC: 2.69 MIL/uL — ABNORMAL LOW (ref 4.22–5.81)
RDW: 17.1 % — ABNORMAL HIGH (ref 11.5–15.5)
WBC: 15.9 10*3/uL — AB (ref 4.0–10.5)

## 2014-03-12 NOTE — Progress Notes (Signed)
Name: Trenton Foundsnthony R Blowe MRN: [id number] DOB: 1950-12-06    ADMISSION DATE:  02/09/2014 CONSULTATION DATE: 1/7  [facility] : [facility]  CHIEF COMPLAINT:  FTT  BRIEF PATIENT DESCRIPTION:   64 [facility] with AECOPD with VDRF and eventual tracheostomy, sepsis with Staph bacteremia, Varicella zoster, CHF, PE/DVT.  [facility]  SIGNIFICANT EVENTS  1/7  tx to ssh 2/2  Weaning on ATC 60%  STUDIES:  CT neck 1/9 >>NAD CXR 2/8 >>> persistent LUL and bibasilar pulm infiltrates.  Cavitation in LUL can not be excluded.   SUBJECTIVE:  PS 12 attempt    VITAL SIGNS:  Febrile to 103.1, intermittently tachycardic.  Otherwise VSS.  PHYSICAL EXAMINATION: General:  Obese, white male, awake  Neuro:  Alert, moves ext's spontaneously HEENT:  Clear airway Cardiovascular:  RRR, no M/R/G. Lungs: left greater rt ronchi Abdomen:  PEG Obese.  Musculoskeletal: No deformities, no edema. Skin: Lower extremities with chronic vascular changes.  Recent Labs Lab 03/09/14 1514 03/11/14 0540 03/12/14 0600  NA 143 144 144  K 4.1 3.3* 3.0*  CL 94* 95* 96  CO2 43* 39* 43*  BUN 29* 28* 28*  CREATININE 0.61 0.65 0.48*  GLUCOSE 293* 372* 384*    Recent Labs Lab 03/09/14 1514 03/11/14 0540 03/12/14 0600  HGB 7.7* 8.1* 8.1*  HCT 25.6* 27.6* 27.5*  WBC 16.6* 19.0* 15.9*  PLT 459* 401* 337   Ct Soft Tissue Neck W Contrast  03/11/2014   CLINICAL DATA:  Respiratory failure, tracheostomy, respiratory failure, sepsis and pneumonia. Increased secretions from around tracheostomy site.  EXAM: CT NECK AND CHEST WITH CONTRAST  TECHNIQUE: Multidetector CT imaging of the neck and chest was performed using the standard protocol after bolus administration of intravenous contrast.  CONTRAST:  100mL OMNIPAQUE IOHEXOL 300 MG/ML  SOLN  COMPARISON:  CT of the neck on 02/07/2014 and prior CT chest studies on 01/31/2014 and 01/15/2014.  FINDINGS: CT NECK FINDINGS  There  is an air-fluid level in the trachea proximal to the insertion site of a tracheostomy. The tracheostomy enters to the right of midline with the tip extending into the lower trachea. There is no evidence of focal inflammatory process in the neck. No focal abscess is identified. No evidence of retropharyngeal soft tissue swelling.  Some dental caries and periapical disease noted in the mandible without significant bony destruction. No significant sinus disease seen. No evidence of lymphadenopathy in the neck. No vascular abnormalities. Diffuse spondylosis noted of the thoracic spine.  CT CHEST FINDINGS  Stable subcutaneous cyst in the fat at the base of the neck and in the upper thoracic region measuring roughly 3.8 x 5.0 cm on axial images. This was present on the 01/09 study but not on the 1/2 study. This has internal density of less than 1 Hounsfield unit, consistent with fluid. This most likely represents a sebaceous cyst or seroma.  Lungs show new extensive cavitary pneumonia of the left upper lobe not present on the January study. The apex demonstrates significant cavitation. Posterior right lower lobe pneumonia has improved significantly with some residual density remaining at the posterior right lung base likely representing residual atelectasis/ scarring. No edema or pleural fluid identified.  The heart size is normal. Stable atherosclerosis of the coronary arteries. No enlarged lymph nodes are seen. Degenerative changes are present of the thoracic spine.  IMPRESSION: 1. Air-fluid level in the trachea proximal to the indwelling tracheostomy. No evidence of focal inflammatory  process or abscess in the neck. 2. Dental caries and periapical disease of mandibular teeth. 3. Posterior subcutaneous cyst in the fat at the base of the neck and in the upper thoracic region. This was present on the 01/09 study but not on the 01/02 study and presumably represents a sebaceous cyst or seroma. Internal density is consistent  with simple fluid. 4. New, extensive cavitary pneumonia of the left upper lobe not present on the January study. Posterior right lower lobe pneumonia has improved significantly since the prior chest CT.   Electronically Signed   By: Irish Lack M.D.   On: 03/11/2014 17:20   Ct Chest W Contrast  03/11/2014   CLINICAL DATA:  Respiratory failure, tracheostomy, respiratory failure, sepsis and pneumonia. Increased secretions from around tracheostomy site.  EXAM: CT NECK AND CHEST WITH CONTRAST  TECHNIQUE: Multidetector CT imaging of the neck and chest was performed using the standard protocol after bolus administration of intravenous contrast.  CONTRAST:  OMNIPAQUE IOHEXOL 300 MG/ML  SOLN  COMPARISON:  CT of the neck on 02/07/2014 and prior CT chest studies on 01/31/2014 and 01/15/2014.  FINDINGS: CT NECK FINDINGS  There is an air-fluid level in the trachea proximal to the insertion site of a tracheostomy. The tracheostomy enters to the right of midline with the tip extending into the lower trachea. There is no evidence of focal inflammatory process in the neck. No focal abscess is identified. No evidence of retropharyngeal soft tissue swelling.  Some dental caries and periapical disease noted in the mandible without significant bony destruction. No significant sinus disease seen. No evidence of lymphadenopathy in the neck. No vascular abnormalities. Diffuse spondylosis noted of the thoracic spine.  CT CHEST FINDINGS  Stable subcutaneous cyst in the fat at the base of the neck and in the upper thoracic region measuring roughly 3.8 x 5.0 cm on axial images. This was present on the 01/09 study but not on the 1/2 study. This has internal density of less than 1 Hounsfield unit, consistent with fluid. This most likely represents a sebaceous cyst or seroma.  Lungs show new extensive cavitary pneumonia of the left upper lobe not present on the January study. The apex demonstrates significant cavitation. Posterior  right lower lobe pneumonia has improved significantly with some residual density remaining at the posterior right lung base likely representing residual atelectasis/ scarring. No edema or pleural fluid identified.  The heart size is normal. Stable atherosclerosis of the coronary arteries. No enlarged lymph nodes are seen. Degenerative changes are present of the thoracic spine.  IMPRESSION: 1. Air-fluid level in the trachea proximal to the indwelling tracheostomy. No evidence of focal inflammatory process or abscess in the neck. 2. Dental caries and periapical disease of mandibular teeth. 3. Posterior subcutaneous cyst in the fat at the base of the neck and in the upper thoracic region. This was present on the 01/09 study but not on the 01/02 study and presumably represents a sebaceous cyst or seroma. Internal density is consistent with simple fluid. 4. New, extensive cavitary pneumonia of the left upper lobe not present on the January study. Posterior right lower lobe pneumonia has improved significantly since the prior chest CT.   Electronically Signed   By: Irish Lack M.D.   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  Question whether  this could represent drug fever vs ongoing / worsening infection ? Cavitary PNA, mrsa likely  Hx of OSA PE/DVT Tobacco abuse Hx of HTN, systolic CHF, CAD Varicella zoster Subacute encephalitis w/ ongoing delirium   Plan: CT reviewed, c/w MRSA to cavitate, noneed to bronch for additional organims unless lacks improvement Will need repeat CT chest imaging in 10 days Continued mrsa treatment, zyvox, consider 21 days in total with imaging PS 12 attempt to gal 4-6 hr in am  Lasix increased  Mcarthur Rossetti. Tyson Alias, MD, FACP Pgr: [phone] [place] [facility]

## 2014-03-13 LAB — BASIC METABOLIC PANEL
Anion gap: 11 (ref 5–15)
BUN: 27 mg/dL — AB (ref 6–23)
CHLORIDE: 96 mmol/L (ref 96–112)
CO2: 38 mmol/L — ABNORMAL HIGH (ref 19–32)
Calcium: 9 mg/dL (ref 8.4–10.5)
Creatinine, Ser: 0.49 mg/dL — ABNORMAL LOW (ref 0.50–1.35)
GFR calc Af Amer: >90 mL/min (ref >90)
GFR calc non Af Amer: >90 mL/min (ref >90)
GLUCOSE: 407 mg/dL — AB (ref 70–99)
Potassium: 3.2 mmol/L — ABNORMAL LOW (ref 3.5–5.1)
Sodium: 145 mmol/L (ref 135–145)

## 2014-03-13 LAB — CBC
HEMATOCRIT: 28.9 % — AB (ref 39.0–52.0)
Hemoglobin: 8.3 g/dL — ABNORMAL LOW (ref 13.0–17.0)
MCH: 29.6 pg (ref 26.0–34.0)
MCHC: 28.7 g/dL — ABNORMAL LOW (ref 30.0–36.0)
MCV: 103.2 fL — AB (ref 78.0–100.0)
Platelets: 306 10*3/uL (ref 150–400)
RBC: 2.8 MIL/uL — AB (ref 4.22–5.81)
RDW: 17.6 % — ABNORMAL HIGH (ref 11.5–15.5)
WBC: 14.3 10*3/uL — AB (ref 4.0–10.5)

## 2014-03-13 LAB — PROCALCITONIN: Procalcitonin: <0.1 ng/mL

## 2014-03-14 LAB — BLOOD GAS, ARTERIAL
Acid-Base Excess: 20.3 mmol/L — ABNORMAL HIGH (ref 0.0–2.0)
BICARBONATE: 45.8 meq/L — AB (ref 20.0–24.0)
FIO2: 0.4 %
O2 SAT: 80.6 %
PO2 ART: 52.7 mmHg — AB (ref 80.0–100.0)
Patient temperature: 101.6
TCO2: 47.8 mmol/L (ref 0–100)
pCO2 arterial: 69.5 mmHg (ref 35.0–45.0)
pH, Arterial: 7.443 (ref 7.350–7.450)

## 2014-03-15 ENCOUNTER — Other Ambulatory Visit: Payer: Self-pay

## 2014-03-15 LAB — CULTURE, BLOOD (ROUTINE X 2)
CULTURE: NO GROWTH
CULTURE: NO GROWTH

## 2014-03-15 LAB — BASIC METABOLIC PANEL
BUN: 31 mg/dL — AB (ref 6–23)
Calcium: 9.2 mg/dL (ref 8.4–10.5)
Chloride: 95 mmol/L — ABNORMAL LOW (ref 96–112)
Creatinine, Ser: 0.45 mg/dL — ABNORMAL LOW (ref 0.50–1.35)
GFR calc non Af Amer: >90 mL/min (ref >90)
Glucose, Bld: 364 mg/dL — ABNORMAL HIGH (ref 70–99)
Potassium: 2.4 mmol/L — CL (ref 3.5–5.1)
Sodium: 146 mmol/L — ABNORMAL HIGH (ref 135–145)

## 2014-03-15 LAB — MAGNESIUM: Magnesium: 1.7 mg/dL (ref 1.5–2.5)

## 2014-03-16 LAB — CULTURE, RESPIRATORY

## 2014-03-16 LAB — CULTURE, RESPIRATORY W GRAM STAIN

## 2014-03-16 LAB — BASIC METABOLIC PANEL
Anion gap: 6 (ref 5–15)
Anion gap: 6 (ref 5–15)
BUN: 27 mg/dL — AB (ref 6–23)
BUN: 29 mg/dL — ABNORMAL HIGH (ref 6–23)
CALCIUM: 9.3 mg/dL (ref 8.4–10.5)
CHLORIDE: 107 mmol/L (ref 96–112)
CO2: 35 mmol/L — ABNORMAL HIGH (ref 19–32)
CO2: 43 mmol/L (ref 19–32)
Calcium: 8 mg/dL — ABNORMAL LOW (ref 8.4–10.5)
Chloride: 100 mmol/L (ref 96–112)
Creatinine, Ser: 0.39 mg/dL — ABNORMAL LOW (ref 0.50–1.35)
Creatinine, Ser: 0.5 mg/dL (ref 0.50–1.35)
GFR calc Af Amer: >90 mL/min (ref >90)
GFR calc Af Amer: >90 mL/min (ref >90)
GFR calc non Af Amer: >90 mL/min (ref >90)
GLUCOSE: 225 mg/dL — AB (ref 70–99)
Glucose, Bld: 260 mg/dL — ABNORMAL HIGH (ref 70–99)
POTASSIUM: 2.7 mmol/L — AB (ref 3.5–5.1)
POTASSIUM: 2.7 mmol/L — AB (ref 3.5–5.1)
SODIUM: 148 mmol/L — AB (ref 135–145)
Sodium: 149 mmol/L — ABNORMAL HIGH (ref 135–145)

## 2014-03-16 LAB — MAGNESIUM: MAGNESIUM: 1.5 mg/dL (ref 1.5–2.5)

## 2014-03-16 LAB — POTASSIUM: POTASSIUM: 3.3 mmol/L — AB (ref 3.5–5.1)

## 2014-03-17 DIAGNOSIS — J15212 Pneumonia due to Methicillin resistant Staphylococcus aureus: Secondary | ICD-10-CM

## 2014-03-17 DIAGNOSIS — A498 Other bacterial infections of unspecified site: Secondary | ICD-10-CM

## 2014-03-17 DIAGNOSIS — Z1624 Resistance to multiple antibiotics: Secondary | ICD-10-CM

## 2014-03-17 LAB — BASIC METABOLIC PANEL
Anion gap: 3 — ABNORMAL LOW (ref 5–15)
BUN: 34 mg/dL — AB (ref 6–23)
CO2: 43 mmol/L — AB (ref 19–32)
CREATININE: 0.44 mg/dL — AB (ref 0.50–1.35)
Calcium: 9.3 mg/dL (ref 8.4–10.5)
Chloride: 103 mmol/L (ref 96–112)
GFR calc Af Amer: >90 mL/min (ref >90)
GFR calc non Af Amer: >90 mL/min (ref >90)
Glucose, Bld: 317 mg/dL — ABNORMAL HIGH (ref 70–99)
Potassium: 3.2 mmol/L — ABNORMAL LOW (ref 3.5–5.1)
Sodium: 149 mmol/L — ABNORMAL HIGH (ref 135–145)

## 2014-03-17 NOTE — Progress Notes (Signed)
   Name: Robert Mccarthy MRN: [id number] DOB: 11/29/1950    ADMISSION DATE:  02/20/2014 CONSULTATION DATE: 02/07/2014  [facility]  CHIEF COMPLAINT:  FTT  BRIEF PATIENT DESCRIPTION:   64 y[facility] with AECOPD with VDRF and eventual tracheostomy, sepsis with Staph bacteremia, Varicella zoster, CHF, PE/DVT.  [facility]  SIGNIFICANT EVENTS  1/7  tx to ssh 2/2  Weaning on ATC 60%  STUDIES:  CT neck 1/9 >> NAD CT neck 2/10 >> air fluid level in trachea CT chest 2/10 >> new extensive cavitary PNA LUL, decrease RLL PNA   CULTURES: Sputum 1/10 >> Citrobacter, Stenotrophomonas Sputum 1/29 >> MRSA Sputum 2/08 >> Stenotrophomonas, Citrobacter  SUBJECTIVE:  Unable to tolerate vent weaning.  Still has fevers and increased respiratory secretions.    VITAL SIGNS: Reviewed in bedside chart.  PHYSICAL EXAMINATION: General:  Obese, white male, awake  Neuro:  Alert, moves ext's spontaneously HEENT:  Clear airway Cardiovascular:  RRR, no M/R/G. Lungs: left greater rt ronchi Abdomen:  PEG Obese.  Musculoskeletal: No deformities, no edema. Skin: Lower extremities with chronic vascular changes.   Recent Labs Lab 03/16/14 1750 03/16/14 2042 03/17/14 1150  NA 148* 149* 149*  K 2.7* 2.7* 3.2*  CL 107 100 103  CO2 35* 43* 43*  BUN 27* 29* 34*  CREATININE 0.39* 0.50 0.44*  GLUCOSE 260* 225* 317*    Recent Labs Lab 03/11/14 0540 03/12/14 0600 03/13/14 0640  HGB 8.1* 8.1* 8.3*  HCT 27.6* 27.5* 28.9*  WBC 19.0* 15.9* 14.3*  PLT 401* 337 306   No results found.  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 Citrobacter, Stenotrophomonas, and MRSA tracheobronchitis/PNA.  Hx of OSA. PE/DVT. Tobacco abuse. Hx of HTN, systolic CHF, CAD. Varicella zoster. Subacute encephalitis w/ ongoing delirium. Unstageable sacral decubitus ulcer  PLAN  Continue Abx per primary team and ID Consider  changing PICC line Continue full vent support until more stable Tracheostomy care F/u CXR intermittently Defer bronchoscopy for now  CC time 35 minutes.  Coralyn HellingVineet Jireh Vinas, MD [facility] 03/17/2014, 1:18 PM Pager:  [phone] After 3pm call: [phone]

## 2014-03-19 ENCOUNTER — Other Ambulatory Visit (HOSPITAL_COMMUNITY): Payer: Medicare Other

## 2014-03-19 LAB — COMPREHENSIVE METABOLIC PANEL
ALT: 68 U/L — ABNORMAL HIGH (ref 0–53)
ANION GAP: 3 — AB (ref 5–15)
AST: 35 U/L (ref 0–37)
Albumin: 1.8 g/dL — ABNORMAL LOW (ref 3.5–5.2)
Alkaline Phosphatase: 89 U/L (ref 39–117)
BUN: 39 mg/dL — ABNORMAL HIGH (ref 6–23)
CALCIUM: 9.9 mg/dL (ref 8.4–10.5)
CO2: 42 mmol/L — AB (ref 19–32)
CREATININE: 0.5 mg/dL (ref 0.50–1.35)
Chloride: 108 mmol/L (ref 96–112)
Glucose, Bld: 340 mg/dL — ABNORMAL HIGH (ref 70–99)
Potassium: 5.3 mmol/L — ABNORMAL HIGH (ref 3.5–5.1)
SODIUM: 153 mmol/L — AB (ref 135–145)
Total Bilirubin: 0.7 mg/dL (ref 0.3–1.2)
Total Protein: 5.8 g/dL — ABNORMAL LOW (ref 6.0–8.3)

## 2014-03-19 LAB — CBC
HCT: 29.3 % — ABNORMAL LOW (ref 39.0–52.0)
HEMOGLOBIN: 8.1 g/dL — AB (ref 13.0–17.0)
MCH: 30.3 pg (ref 26.0–34.0)
MCHC: 27.6 g/dL — AB (ref 30.0–36.0)
MCV: 109.7 fL — ABNORMAL HIGH (ref 78.0–100.0)
Platelets: 209 10*3/uL (ref 150–400)
RBC: 2.67 MIL/uL — ABNORMAL LOW (ref 4.22–5.81)
RDW: 19.4 % — ABNORMAL HIGH (ref 11.5–15.5)
WBC: 12.4 10*3/uL — ABNORMAL HIGH (ref 4.0–10.5)

## 2014-03-19 NOTE — Progress Notes (Signed)
   Name: Trenton Foundsnthony R Maxham MRN: [id number] DOB: Feb 14, 1950    ADMISSION DATE:  01/31/2014 CONSULTATION DATE: 02/13/2014  [facility]  CHIEF COMPLAINT:  FTT  BRIEF PATIENT DESCRIPTION:   64 y/o [facility] with AECOPD with VDRF and eventual tracheostomy, sepsis with Staph bacteremia, Varicella zoster, CHF, PE/DVT.  [facility].  SIGNIFICANT EVENTS  1/7  tx to ssh 2/2  Weaning on ATC 60%  STUDIES:  CT neck 1/9 >> NAD CT neck 2/10 >> air fluid level in trachea CT chest 2/10 >> new extensive cavitary PNA LUL, decrease RLL PNA   CULTURES: Sputum 1/10 >> Citrobacter, Stenotrophomonas Sputum 1/29 >> MRSA Sputum 2/08 >> Stenotrophomonas, Citrobacter  SUBJECTIVE:  Unable to tolerate vent weaning.  Fever curve     VITAL SIGNS: Reviewed in bedside chart.  PHYSICAL EXAMINATION: General:  Obese, white male, awake  Neuro:  Alert, moves ext's spontaneously HEENT:  Clear airway Cardiovascular:  RRR, no M/R/G. Lungs: left greater rt ronchi Abdomen:  PEG Obese.  Musculoskeletal: No deformities, no edema. Skin: Lower extremities with chronic vascular changes.   Recent Labs Lab 03/16/14 2042 03/17/14 1150 03/19/14 0740  NA 149* 149* 153*  K 2.7* 3.2* 5.3*  CL 100 103 108  CO2 43* 43* 42*  BUN 29* 34* 39*  CREATININE 0.50 0.44* 0.50  GLUCOSE 225* 317* 340*    Recent Labs Lab 03/13/14 0640 03/19/14 0740  HGB 8.3* 8.1*  HCT 28.9* 29.3*  WBC 14.3* 12.4*  PLT 306 209   Dg Chest Port 1 View  03/19/2014   CLINICAL DATA:  Respiratory failure  EXAM: PORTABLE CHEST - 1 VIEW  COMPARISON:  03/09/2014  FINDINGS: There is a tracheostomy appliance in the midline. There is a left sided central line with tip in the SVC. Airspace opacities are scattered bilaterally, both bases and also left upper lobe. Little or no interval change from 03/09/2014. No large effusions are evident.  IMPRESSION: Persistent multifocal airspace opacities  without significant interval change.   Electronically Signed   By: Ellery Plunkaniel R Mitchell M.D.   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 Citrobacter, Stenotrophomonas, and MRSA tracheobronchitis/PNA.  Hx of OSA. PE/DVT. Tobacco abuse. Hx of HTN, systolic CHF, CAD. Varicella zoster. Subacute encephalitis w/ ongoing delirium. Unstageable sacral decubitus ulcer  PLAN  Continue Abx per primary team and ID Continue full vent support until more stable Tracheostomy care F/u CXR intermittently Defer bronchoscopy for now  [facility] during multidisciplinary rounds  CC time 35 minutes.  Coralyn HellingVineet Davanee Klinkner, MD [facility] 03/19/2014, 11:41 AM Pager:  [phone] After 3pm call: [phone]

## 2014-03-20 ENCOUNTER — Other Ambulatory Visit (HOSPITAL_COMMUNITY): Payer: Medicare Other

## 2014-03-20 LAB — BASIC METABOLIC PANEL
Anion gap: 6 (ref 5–15)
BUN: 37 mg/dL — ABNORMAL HIGH (ref 6–23)
CHLORIDE: 109 mmol/L (ref 96–112)
CO2: 42 mmol/L (ref 19–32)
Calcium: 10 mg/dL (ref 8.4–10.5)
Creatinine, Ser: 0.48 mg/dL — ABNORMAL LOW (ref 0.50–1.35)
GFR calc non Af Amer: >90 mL/min (ref >90)
Glucose, Bld: 245 mg/dL — ABNORMAL HIGH (ref 70–99)
POTASSIUM: 4.3 mmol/L (ref 3.5–5.1)
Sodium: 157 mmol/L — ABNORMAL HIGH (ref 135–145)

## 2014-03-20 MED ORDER — IOHEXOL 300 MG/ML  SOLN
100.0000 mL | Freq: Once | INTRAMUSCULAR | Status: AC | PRN
  Administered 2014-03-20: 100 mL via INTRAVENOUS

## 2014-03-21 LAB — BASIC METABOLIC PANEL
Anion gap: 9 (ref 5–15)
BUN: 33 mg/dL — ABNORMAL HIGH (ref 6–23)
CO2: 35 mmol/L — AB (ref 19–32)
Calcium: 9.7 mg/dL (ref 8.4–10.5)
Chloride: 106 mmol/L (ref 96–112)
Creatinine, Ser: 0.37 mg/dL — ABNORMAL LOW (ref 0.50–1.35)
GFR calc non Af Amer: >90 mL/min (ref >90)
Glucose, Bld: 381 mg/dL — ABNORMAL HIGH (ref 70–99)
Potassium: 4.2 mmol/L (ref 3.5–5.1)
Sodium: 150 mmol/L — ABNORMAL HIGH (ref 135–145)

## 2014-03-23 DIAGNOSIS — J432 Centrilobular emphysema: Secondary | ICD-10-CM

## 2014-03-23 NOTE — Progress Notes (Signed)
   Name: Robert Mccarthy MRN: [id number] DOB: 08-18-1950    ADMISSION DATE:  02/26/2014 CONSULTATION DATE: 02/10/2014  [facility]  CHIEF COMPLAINT:  FTT  BRIEF PATIENT DESCRIPTION:   64 y/[facility] with AECOPD with VDRF and eventual tracheostomy, sepsis with Staph bacteremia, Varicella zoster, CHF, PE/DVT.  [facility]  SIGNIFICANT EVENTS  1/7  tx to ssh 2/2  Weaning on ATC 60%  STUDIES:  CT neck 1/9 >> NAD CT neck 2/10 >> air fluid level in trachea CT chest 2/10 >> new extensive cavitary PNA LUL, decrease RLL PNA   CULTURES: Sputum 1/10 >> Citrobacter, Stenotrophomonas Sputum 1/29 >> MRSA Sputum 2/08 >> Stenotrophomonas, Citrobacter  SUBJECTIVE:      VITAL SIGNS: Reviewed in bedside chart.  PHYSICAL EXAMINATION: General:  Obese, white male, awake, anxious  Neuro:  Alert, moves ext's spontaneously HEENT:  Clear airway, trach unremarkable  Cardiovascular:  RRR, no M/R/G. Lungs: left greater rt ronchi Abdomen:  PEG Obese.  Musculoskeletal: No deformities, no edema. Skin: Lower extremities with chronic vascular changes.   Recent Labs Lab 03/19/14 0740 03/20/14 0800 03/21/14 0849  NA 153* 157* 150*  K 5.3* 4.3 4.2  CL 108 109 106  CO2 42* 42* 35*  BUN 39* 37* 33*  CREATININE 0.50 0.48* 0.37*  GLUCOSE 340* 245* 381*    Recent Labs Lab 03/19/14 0740  HGB 8.1*  HCT 29.3*  WBC 12.4*  PLT 209   No results found.  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 Citrobacter, Stenotrophomonas, and MRSA tracheobronchitis/PNA.  Hx of OSA. PE/DVT. Tobacco abuse. Hx of HTN, systolic CHF, CAD. Varicella zoster. Subacute encephalitis w/ ongoing delirium. Unstageable sacral decubitus ulcer Hypernatremia -->improving   Discussion Per RT he continues to fail attempts at SBT. He has significant bilateral airspace disease/PNA on most recent film. He still seems to have some  residual delirium, this with the addition of his marked PNA and deconditioning after such a prolonged illness seem to be the major barrier to weaning. He may require vent/SNF   PLAN Repeat CXR Resume weaning efforts.  Continue Abx per primary team and ID Tracheostomy care F/u CXR intermittently Consider vent/SNF  Simonne MartinetPeter E Babcock ACNP-BC [facility] Pager # [phone] OR # [phone] if no answer     Attending:  I have seen and examined the patient with nurse practitioner/resident and agree with the note above.   He really has not progressed from a respiratory standpoint;  Overall predicted mortality high. Still with air trapping on my exam, tachypneic Continue pulm toilette, Would consider vent/SNF if family wants continued aggressive care, they were not present on our visit today  Heber CarolinaBrent McQuaid, MD [place] [facility] Pager: [phone] Cell: [phone] If no response, call [phone]

## 2014-03-25 LAB — BASIC METABOLIC PANEL
ANION GAP: 6 (ref 5–15)
BUN: 16 mg/dL (ref 6–23)
CHLORIDE: 90 mmol/L — AB (ref 96–112)
CO2: 43 mmol/L (ref 19–32)
Calcium: 8.9 mg/dL (ref 8.4–10.5)
Glucose, Bld: 310 mg/dL — ABNORMAL HIGH (ref 70–99)
Potassium: 2.7 mmol/L — CL (ref 3.5–5.1)
SODIUM: 139 mmol/L (ref 135–145)

## 2014-03-26 DIAGNOSIS — J852 Abscess of lung without pneumonia: Secondary | ICD-10-CM

## 2014-03-26 DIAGNOSIS — J9601 Acute respiratory failure with hypoxia: Secondary | ICD-10-CM

## 2014-03-26 LAB — CULTURE, BLOOD (ROUTINE X 2)
CULTURE: NO GROWTH
CULTURE: NO GROWTH

## 2014-03-26 LAB — BASIC METABOLIC PANEL
Anion gap: 3 — ABNORMAL LOW (ref 5–15)
BUN: 19 mg/dL (ref 6–23)
CHLORIDE: 93 mmol/L — AB (ref 96–112)
CO2: 45 mmol/L (ref 19–32)
Calcium: 9.2 mg/dL (ref 8.4–10.5)
Glucose, Bld: 176 mg/dL — ABNORMAL HIGH (ref 70–99)
Potassium: 3.4 mmol/L — ABNORMAL LOW (ref 3.5–5.1)
Sodium: 141 mmol/L (ref 135–145)

## 2014-03-26 NOTE — Progress Notes (Signed)
   Name: Robert Mccarthy MRN: [id number] DOB: 12/30/1950    ADMISSION DATE:  02/28/2014 CONSULTATION DATE: 02/08/2014  [facility]  CHIEF COMPLAINT:  FTT  BRIEF PATIENT DESCRIPTION:   64 y/o male [facility] with AECOPD with VDRF and eventual tracheostomy, sepsis with Staph bacteremia, Varicella zoster, CHF, PE/DVT.  [facility]  SIGNIFICANT EVENTS  1/7  tx to ssh 2/2  Weaning on ATC 60%  STUDIES:  CT neck 1/9 >> NAD CT neck 2/10 >> air fluid level in trachea CT chest 2/10 >> new extensive cavitary PNA LUL, decrease RLL PNA   CULTURES: Sputum 1/10 >> Citrobacter, Stenotrophomonas Sputum 1/29 >> MRSA Sputum 2/08 >> Stenotrophomonas, Citrobacter  SUBJECTIVE:      VITAL SIGNS: Reviewed in bedside chart.  PHYSICAL EXAMINATION: General:  Obese, white male, awake, anxious  Neuro:  Alert, moves ext's spontaneously HEENT:  Clear airway, trach unremarkable  Cardiovascular:  RRR, no M/R/G. Lungs: left greater rt ronchi Abdomen:  PEG Obese.  Musculoskeletal: No deformities, no edema. Skin: Lower extremities with chronic vascular changes.   Recent Labs Lab 03/20/14 0800 03/21/14 0849 03/25/14 1430  NA 157* 150* 139  K 4.3 4.2 2.7*  CL 109 106 90*  CO2 42* 35* 43*  BUN 37* 33* 16  CREATININE 0.48* 0.37* <0.30*  GLUCOSE 245* 381* 310*   No results for input(s): HGB, HCT, WBC, PLT in the last 168 hours. No results found.  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 Hx of OSA PE/DVT Tobacco abuse Hx of HTN, systolic CHF, CAD Varicella zoster Subacute encephalitis w/ ongoing delirium Unstageable sacral decubitus ulcer Hypernatremia -->improving   Discussion He continues to fail SBT.  I reviewed his CT chest at length this week showing emphysema and a large abscess.  He has failed weaning efforts and aggressive  antibiotic therapy.  Chance of meaningful recovery is dismal.    PLAN Use PSV as tolerated for comfort, I don't think he will wean Continue Abx per primary team and ID Tracheostomy care F/u CXR intermittently Consider vent/SNF  Would strongly consider palliative/withdrawal of care.  I don't believe he will survive this and he appears to be suffering.  PCCM will sign off, call if questions.  Heber CarolinaBrent McQuaid, MD [place] [facility] Pager: [phone] Cell: [phone] If no response, call [phone]

## 2014-03-29 ENCOUNTER — Other Ambulatory Visit (HOSPITAL_COMMUNITY): Payer: Medicare Other

## 2014-03-29 LAB — BASIC METABOLIC PANEL
BUN: 23 mg/dL (ref 6–23)
CO2: 49 mmol/L — AB (ref 19–32)
Calcium: 9.1 mg/dL (ref 8.4–10.5)
Chloride: 93 mmol/L — ABNORMAL LOW (ref 96–112)
Creatinine, Ser: <0.3 mg/dL — ABNORMAL LOW (ref 0.50–1.35)
GLUCOSE: 223 mg/dL — AB (ref 70–99)
Potassium: 4 mmol/L (ref 3.5–5.1)
Sodium: 144 mmol/L (ref 135–145)

## 2014-03-29 LAB — CBC
HEMATOCRIT: 29.1 % — AB (ref 39.0–52.0)
HEMOGLOBIN: 8.4 g/dL — AB (ref 13.0–17.0)
MCH: 30.9 pg (ref 26.0–34.0)
MCHC: 28.9 g/dL — ABNORMAL LOW (ref 30.0–36.0)
MCV: 107 fL — AB (ref 78.0–100.0)
PLATELETS: 84 10*3/uL — AB (ref 150–400)
RBC: 2.72 MIL/uL — AB (ref 4.22–5.81)
RDW: 21 % — ABNORMAL HIGH (ref 11.5–15.5)
WBC: 15.8 10*3/uL — ABNORMAL HIGH (ref 4.0–10.5)

## 2014-03-29 LAB — BLOOD GAS, ARTERIAL
ACID-BASE EXCESS: 20.4 mmol/L — AB (ref 0.0–2.0)
Bicarbonate: 47 mEq/L — ABNORMAL HIGH (ref 20.0–24.0)
FIO2: 50 %
LHR: 18 {breaths}/min
MECHVT: 550 mL
O2 SAT: 96 %
PATIENT TEMPERATURE: 99.2
PCO2 ART: 86.4 mmHg — AB (ref 35.0–45.0)
PEEP/CPAP: 5 cmH2O
PO2 ART: 88.2 mmHg (ref 80.0–100.0)
TCO2: 49.6 mmol/L (ref 0–100)
pH, Arterial: 7.357 (ref 7.350–7.450)

## 2014-03-30 DIAGNOSIS — J9621 Acute and chronic respiratory failure with hypoxia: Secondary | ICD-10-CM

## 2014-03-30 LAB — BLOOD GAS, ARTERIAL
Acid-Base Excess: 22.1 mmol/L — ABNORMAL HIGH (ref 0.0–2.0)
BICARBONATE: 48.5 meq/L — AB (ref 20.0–24.0)
FIO2: 45 %
MECHVT: 500 mL
O2 Saturation: 92.6 %
PCO2 ART: 79.4 mmHg — AB (ref 35.0–45.0)
PEEP: 5 cmH2O
PH ART: 7.403 (ref 7.350–7.450)
Patient temperature: 98.6
RATE: 20 resp/min
TCO2: 50.9 mmol/L (ref 0–100)
pO2, Arterial: 66.8 mmHg — ABNORMAL LOW (ref 80.0–100.0)

## 2014-03-30 NOTE — Progress Notes (Signed)
Name: Robert Mccarthy MRN: [id number] DOB: January 19, 1951    ADMISSION DATE:  02/17/2014 CONSULTATION DATE: 01/31/2014   [facility] : [facility]  CHIEF COMPLAINT:  FTT  BRIEF PATIENT DESCRIPTION:   64 y/o [facility] with AECOPD with VDRF and eventual tracheostomy, sepsis with Staph bacteremia, Varicella zoster, CHF, PE/DVT.  [facility]  SIGNIFICANT EVENTS  1/7  tx to ssh 2/2  Weaning on ATC 60%  STUDIES:  CT neck 1/9 >> NAD CT neck 2/10 >> air fluid level in trachea CT chest 2/10 >> new extensive cavitary PNA LUL, decrease RLL PNA   CULTURES: Sputum 1/10 >> Citrobacter, Stenotrophomonas Sputum 1/29 >> MRSA Sputum 2/08 >> Stenotrophomonas, Citrobacter  SUBJECTIVE:  03/30/2014: unable to wean off vent. Not on pressors     VITAL SIGNS: Reviewed in bedside chart.  PHYSICAL EXAMINATION: General:  Obese, white male, awake,  Neuro:  Alert, moves ext's spontaneously but not following commands HEENT:  Clear airway, trach unremarkable  Cardiovascular:  RRR, no M/R/G. Lungs: left greater rt ronchi Abdomen:  PEG Obese.  Musculoskeletal: No deformities, no edema. Skin: Lower extremities with chronic vascular changes.   PULMONARY  Recent Labs Lab 03/29/14 0544 03/30/14 0412  PHART 7.357 7.403  PCO2ART 86.4* 79.4*  PO2ART 88.2 66.8*  HCO3 47.0* 48.5*  TCO2 49.6 50.9  O2SAT 96.0 92.6    CBC  Recent Labs Lab 03/29/14 0600  HGB 8.4*  HCT 29.1*  WBC 15.8*  PLT 84*    COAGULATION No results for input(s): INR in the last 168 hours.  CARDIAC  No results for input(s): TROPONINI in the last 168 hours. No results for input(s): PROBNP in the last 168 hours.   CHEMISTRY  Recent Labs Lab 03/25/14 1430 03/26/14 1142 03/29/14 0600  NA 139 141 144  K 2.7* 3.4* 4.0  CL 90* 93* 93*  CO2 43* 45* 49*  GLUCOSE 310* 176* 223*  BUN 16 19 23   CREATININE <0.30* <0.30* <0.30*  CALCIUM 8.9 9.2 9.1   CrCl cannot be  calculated (Unknown ideal weight.).   LIVER No results for input(s): AST, ALT, ALKPHOS, BILITOT, PROT, ALBUMIN, INR in the last 168 hours.   INFECTIOUS No results for input(s): LATICACIDVEN, PROCALCITON in the last 168 hours.   ENDOCRINE CBG (last 3)  No results for input(s): GLUCAP in the last 72 hours.       IMAGING x48h Dg Chest Port 1 View  03/29/2014   CLINICAL DATA:  Pneumonia  EXAM: PORTABLE CHEST - 1 VIEW  COMPARISON:  03/19/2014  FINDINGS: Normal cardiac silhouette. Endotracheal to 7 cm from carina. Right lower lobe pneumonia again demonstrated. Left upper lobe pneumonia with cavitation unchanged.  IMPRESSION: 1. No change in multifocal pneumonia. 2. Endotracheal tube in good position.   Electronically Signed   By: Genevive BiStewart  Edmunds M.D.   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 Hx of OSA PE/DVT Tobacco abuse Hx of HTN, systolic CHF, CAD Varicella zoster Subacute encephalitis w/ ongoing delirium Unstageable sacral decubitus ulcer Hypernatremia -->improving   Discussion 03/30/2014 He continues to fail SBT. Approaching 100 days on vent since acute critical illness onset   PLAN Use PSV as tolerated for comfort, I don't think he will wean Continue Abx per primary team and ID Tracheostomy care F/u CXR intermittently Consider vent/SNF v Pallaitive care / Terminal wean   -  data show that at 100days on vent: 1 year mortality is 100%. Terminal wean and goals of care is appropriate. Palliative care ordered  PCCM will sign off   Dr. Kalman Shan, M.D., [facility].C.P Pulmonary and Critical Care Medicine Staff Physician [facility] Pager: [phone], If no answer or between  15:00h - 7:00h: call [phone]  03/30/2014 12:16 PM

## 2014-03-31 LAB — BLOOD GAS, ARTERIAL
Acid-Base Excess: 23.7 mmol/L — ABNORMAL HIGH (ref 0.0–2.0)
Bicarbonate: 50.2 mEq/L — ABNORMAL HIGH (ref 20.0–24.0)
FIO2: 1 %
LHR: 20 {breaths}/min
MECHVT: 500 mL
O2 Saturation: 100 %
PCO2 ART: 82 mmHg — AB (ref 35.0–45.0)
PEEP/CPAP: 5 cmH2O
PH ART: 7.404 (ref 7.350–7.450)
PO2 ART: 320 mmHg — AB (ref 80.0–100.0)
Patient temperature: 98.6
TCO2: 52.7 mmol/L (ref 0–100)

## 2014-04-01 ENCOUNTER — Other Ambulatory Visit (HOSPITAL_COMMUNITY): Payer: Medicare Other

## 2014-04-01 LAB — GRAM STAIN

## 2014-04-01 LAB — BASIC METABOLIC PANEL
Anion gap: 9 (ref 5–15)
BUN: 36 mg/dL — ABNORMAL HIGH (ref 6–23)
CALCIUM: 8.8 mg/dL (ref 8.4–10.5)
CO2: 48 mmol/L (ref 19–32)
Chloride: 88 mmol/L — ABNORMAL LOW (ref 96–112)
Creatinine, Ser: 0.43 mg/dL — ABNORMAL LOW (ref 0.50–1.35)
GLUCOSE: 215 mg/dL — AB (ref 70–99)
POTASSIUM: 4.1 mmol/L (ref 3.5–5.1)
Sodium: 145 mmol/L (ref 135–145)

## 2014-04-01 LAB — CBC
HEMATOCRIT: 25.5 % — AB (ref 39.0–52.0)
Hemoglobin: 7.2 g/dL — ABNORMAL LOW (ref 13.0–17.0)
MCH: 30.6 pg (ref 26.0–34.0)
MCHC: 28.2 g/dL — AB (ref 30.0–36.0)
MCV: 108.5 fL — ABNORMAL HIGH (ref 78.0–100.0)
PLATELETS: 42 10*3/uL — AB (ref 150–400)
RBC: 2.35 MIL/uL — ABNORMAL LOW (ref 4.22–5.81)
RDW: 20.8 % — AB (ref 11.5–15.5)
WBC: 18.6 10*3/uL — ABNORMAL HIGH (ref 4.0–10.5)

## 2014-04-02 LAB — ABO/RH: ABO/RH(D): A POS

## 2014-04-02 LAB — PREPARE RBC (CROSSMATCH)

## 2014-04-03 LAB — CULTURE, RESPIRATORY

## 2014-04-03 LAB — CULTURE, RESPIRATORY W GRAM STAIN

## 2014-04-04 LAB — TYPE AND SCREEN
ABO/RH(D): A POS
Antibody Screen: NEGATIVE
UNIT DIVISION: 0
Unit division: 0

## 2014-04-04 LAB — VANCOMYCIN, TROUGH: VANCOMYCIN TR: 13.2 ug/mL (ref 10.0–20.0)

## 2014-04-04 LAB — HEMOGLOBIN AND HEMATOCRIT, BLOOD
HCT: 29.6 % — ABNORMAL LOW (ref 39.0–52.0)
HEMOGLOBIN: 8.5 g/dL — AB (ref 13.0–17.0)

## 2014-04-06 ENCOUNTER — Other Ambulatory Visit (HOSPITAL_COMMUNITY): Payer: Medicare Other

## 2014-04-06 LAB — CBC
HCT: 26.9 % — ABNORMAL LOW (ref 39.0–52.0)
HEMOGLOBIN: 7.6 g/dL — AB (ref 13.0–17.0)
MCH: 29.5 pg (ref 26.0–34.0)
MCHC: 28.3 g/dL — ABNORMAL LOW (ref 30.0–36.0)
MCV: 104.3 fL — AB (ref 78.0–100.0)
Platelets: 33 10*3/uL — ABNORMAL LOW (ref 150–400)
RBC: 2.58 MIL/uL — ABNORMAL LOW (ref 4.22–5.81)
RDW: 19 % — ABNORMAL HIGH (ref 11.5–15.5)
WBC: 7.2 10*3/uL (ref 4.0–10.5)

## 2014-04-06 LAB — BASIC METABOLIC PANEL
Anion gap: 0 — ABNORMAL LOW (ref 5–15)
BUN: 32 mg/dL — ABNORMAL HIGH (ref 6–23)
CALCIUM: 8.6 mg/dL (ref 8.4–10.5)
CHLORIDE: 98 mmol/L (ref 96–112)
CO2: 46 mmol/L (ref 19–32)
Creatinine, Ser: 0.32 mg/dL — ABNORMAL LOW (ref 0.50–1.35)
GFR calc Af Amer: >90 mL/min (ref >90)
GFR calc non Af Amer: >90 mL/min (ref >90)
Glucose, Bld: 181 mg/dL — ABNORMAL HIGH (ref 70–99)
Potassium: 3.4 mmol/L — ABNORMAL LOW (ref 3.5–5.1)
SODIUM: 144 mmol/L (ref 135–145)

## 2014-04-06 LAB — VANCOMYCIN, TROUGH: VANCOMYCIN TR: 17 ug/mL (ref 10.0–20.0)

## 2014-04-07 LAB — BASIC METABOLIC PANEL
Anion gap: 5 (ref 5–15)
BUN: 32 mg/dL — ABNORMAL HIGH (ref 6–23)
CHLORIDE: 97 mmol/L (ref 96–112)
CO2: 45 mmol/L (ref 19–32)
Calcium: 9.1 mg/dL (ref 8.4–10.5)
Creatinine, Ser: <0.3 mg/dL — ABNORMAL LOW (ref 0.50–1.35)
GLUCOSE: 187 mg/dL — AB (ref 70–99)
POTASSIUM: 2.9 mmol/L — AB (ref 3.5–5.1)
Sodium: 147 mmol/L — ABNORMAL HIGH (ref 135–145)

## 2014-04-07 LAB — CULTURE, BLOOD (ROUTINE X 2)
CULTURE: NO GROWTH
Culture: NO GROWTH

## 2014-04-08 LAB — POTASSIUM: Potassium: 4.5 mmol/L (ref 3.5–5.1)

## 2014-04-09 LAB — BASIC METABOLIC PANEL
Anion gap: 6 (ref 5–15)
BUN: 36 mg/dL — ABNORMAL HIGH (ref 6–23)
CALCIUM: 9 mg/dL (ref 8.4–10.5)
CHLORIDE: 97 mmol/L (ref 96–112)
CO2: 46 mmol/L (ref 19–32)
Creatinine, Ser: 0.36 mg/dL — ABNORMAL LOW (ref 0.50–1.35)
GFR calc non Af Amer: >90 mL/min (ref >90)
Glucose, Bld: 236 mg/dL — ABNORMAL HIGH (ref 70–99)
POTASSIUM: 3 mmol/L — AB (ref 3.5–5.1)
SODIUM: 149 mmol/L — AB (ref 135–145)

## 2014-04-09 LAB — CBC
HEMATOCRIT: 27.3 % — AB (ref 39.0–52.0)
HEMOGLOBIN: 8.1 g/dL — AB (ref 13.0–17.0)
MCH: 29.8 pg (ref 26.0–34.0)
MCHC: 29.7 g/dL — ABNORMAL LOW (ref 30.0–36.0)
MCV: 100.4 fL — ABNORMAL HIGH (ref 78.0–100.0)
PLATELETS: 13 10*3/uL — AB (ref 150–400)
RBC: 2.72 MIL/uL — ABNORMAL LOW (ref 4.22–5.81)
RDW: 18.7 % — ABNORMAL HIGH (ref 11.5–15.5)
WBC: 7.2 10*3/uL (ref 4.0–10.5)

## 2014-04-10 LAB — CBC
HCT: 27 % — ABNORMAL LOW (ref 39.0–52.0)
Hemoglobin: 7.7 g/dL — ABNORMAL LOW (ref 13.0–17.0)
MCH: 29.1 pg (ref 26.0–34.0)
MCHC: 28.5 g/dL — ABNORMAL LOW (ref 30.0–36.0)
MCV: 101.9 fL — ABNORMAL HIGH (ref 78.0–100.0)
Platelets: 17 10*3/uL — CL (ref 150–400)
RBC: 2.65 MIL/uL — AB (ref 4.22–5.81)
RDW: 18.7 % — ABNORMAL HIGH (ref 11.5–15.5)
WBC: 8.7 10*3/uL (ref 4.0–10.5)

## 2014-04-13 ENCOUNTER — Other Ambulatory Visit (HOSPITAL_COMMUNITY): Payer: Medicare Other

## 2014-04-13 LAB — BASIC METABOLIC PANEL
BUN: 27 mg/dL — ABNORMAL HIGH (ref 6–23)
CHLORIDE: 94 mmol/L — AB (ref 96–112)
CO2: >50 mmol/L (ref 19–32)
CREATININE: 0.38 mg/dL — AB (ref 0.50–1.35)
Calcium: 8.5 mg/dL (ref 8.4–10.5)
GFR calc Af Amer: >90 mL/min (ref >90)
Glucose, Bld: 217 mg/dL — ABNORMAL HIGH (ref 70–99)
Potassium: 3.1 mmol/L — ABNORMAL LOW (ref 3.5–5.1)
Sodium: 150 mmol/L — ABNORMAL HIGH (ref 135–145)

## 2014-04-13 LAB — CBC
HCT: 23.7 % — ABNORMAL LOW (ref 39.0–52.0)
Hemoglobin: 6.7 g/dL — CL (ref 13.0–17.0)
MCH: 29.5 pg (ref 26.0–34.0)
MCHC: 28.3 g/dL — ABNORMAL LOW (ref 30.0–36.0)
MCV: 104.4 fL — AB (ref 78.0–100.0)
Platelets: 56 10*3/uL — ABNORMAL LOW (ref 150–400)
RBC: 2.27 MIL/uL — AB (ref 4.22–5.81)
RDW: 19.4 % — AB (ref 11.5–15.5)
WBC: 14.2 10*3/uL — ABNORMAL HIGH (ref 4.0–10.5)

## 2014-04-13 LAB — PREPARE RBC (CROSSMATCH)

## 2014-04-14 LAB — TYPE AND SCREEN
ABO/RH(D): A POS
Antibody Screen: NEGATIVE
UNIT DIVISION: 0
UNIT DIVISION: 0

## 2014-04-14 LAB — CULTURE, RESPIRATORY W GRAM STAIN

## 2014-04-14 LAB — HEMOGLOBIN AND HEMATOCRIT, BLOOD
HCT: 30.3 % — ABNORMAL LOW (ref 39.0–52.0)
Hemoglobin: 9.2 g/dL — ABNORMAL LOW (ref 13.0–17.0)

## 2014-04-14 LAB — CULTURE, RESPIRATORY: SPECIAL REQUESTS: NORMAL

## 2014-04-16 LAB — BASIC METABOLIC PANEL
Anion gap: 13 (ref 5–15)
BUN: 27 mg/dL — ABNORMAL HIGH (ref 6–23)
CO2: 43 mmol/L (ref 19–32)
CREATININE: 0.44 mg/dL — AB (ref 0.50–1.35)
Calcium: 8.2 mg/dL — ABNORMAL LOW (ref 8.4–10.5)
Chloride: 91 mmol/L — ABNORMAL LOW (ref 96–112)
GFR calc Af Amer: >90 mL/min (ref >90)
GLUCOSE: 271 mg/dL — AB (ref 70–99)
Potassium: 2.2 mmol/L — CL (ref 3.5–5.1)
Sodium: 147 mmol/L — ABNORMAL HIGH (ref 135–145)

## 2014-04-16 LAB — CBC
HCT: 25.9 % — ABNORMAL LOW (ref 39.0–52.0)
HEMOGLOBIN: 7.8 g/dL — AB (ref 13.0–17.0)
MCH: 29.2 pg (ref 26.0–34.0)
MCHC: 30.1 g/dL (ref 30.0–36.0)
MCV: 97 fL (ref 78.0–100.0)
Platelets: 106 10*3/uL — ABNORMAL LOW (ref 150–400)
RBC: 2.67 MIL/uL — ABNORMAL LOW (ref 4.22–5.81)
RDW: 19.4 % — ABNORMAL HIGH (ref 11.5–15.5)
WBC: 17.6 10*3/uL — ABNORMAL HIGH (ref 4.0–10.5)

## 2014-04-16 LAB — MAGNESIUM: Magnesium: 1.6 mg/dL (ref 1.5–2.5)

## 2014-04-17 LAB — CBC
HCT: 26.6 % — ABNORMAL LOW (ref 39.0–52.0)
Hemoglobin: 7.7 g/dL — ABNORMAL LOW (ref 13.0–17.0)
MCH: 29.3 pg (ref 26.0–34.0)
MCHC: 28.9 g/dL — ABNORMAL LOW (ref 30.0–36.0)
MCV: 101.1 fL — AB (ref 78.0–100.0)
PLATELETS: 118 10*3/uL — AB (ref 150–400)
RBC: 2.63 MIL/uL — ABNORMAL LOW (ref 4.22–5.81)
RDW: 19 % — AB (ref 11.5–15.5)
WBC: 16.1 10*3/uL — AB (ref 4.0–10.5)

## 2014-04-17 LAB — BASIC METABOLIC PANEL
ANION GAP: 10 (ref 5–15)
BUN: 28 mg/dL — ABNORMAL HIGH (ref 6–23)
CO2: 44 mmol/L — AB (ref 19–32)
CREATININE: 0.43 mg/dL — AB (ref 0.50–1.35)
Calcium: 8.2 mg/dL — ABNORMAL LOW (ref 8.4–10.5)
Chloride: 91 mmol/L — ABNORMAL LOW (ref 96–112)
GFR calc Af Amer: >90 mL/min (ref >90)
GFR calc non Af Amer: >90 mL/min (ref >90)
Glucose, Bld: 431 mg/dL — ABNORMAL HIGH (ref 70–99)
Potassium: 3.8 mmol/L (ref 3.5–5.1)
Sodium: 145 mmol/L (ref 135–145)

## 2014-04-17 LAB — MAGNESIUM: Magnesium: 1.5 mg/dL (ref 1.5–2.5)

## 2014-04-18 LAB — CBC
HEMATOCRIT: 29.2 % — AB (ref 39.0–52.0)
HEMOGLOBIN: 8.3 g/dL — AB (ref 13.0–17.0)
MCH: 29.3 pg (ref 26.0–34.0)
MCHC: 28.4 g/dL — AB (ref 30.0–36.0)
MCV: 103.2 fL — AB (ref 78.0–100.0)
PLATELETS: 148 10*3/uL — AB (ref 150–400)
RBC: 2.83 MIL/uL — ABNORMAL LOW (ref 4.22–5.81)
RDW: 18.8 % — ABNORMAL HIGH (ref 11.5–15.5)
WBC: 16.9 10*3/uL — AB (ref 4.0–10.5)

## 2014-04-18 LAB — BASIC METABOLIC PANEL
Anion gap: 8 (ref 5–15)
BUN: 34 mg/dL — ABNORMAL HIGH (ref 6–23)
CO2: 47 mmol/L — AB (ref 19–32)
CREATININE: 0.65 mg/dL (ref 0.50–1.35)
Calcium: 8.5 mg/dL (ref 8.4–10.5)
Chloride: 86 mmol/L — ABNORMAL LOW (ref 96–112)
GFR calc Af Amer: >90 mL/min (ref >90)
GFR calc non Af Amer: >90 mL/min (ref >90)
GLUCOSE: 257 mg/dL — AB (ref 70–99)
POTASSIUM: 3.7 mmol/L (ref 3.5–5.1)
Sodium: 141 mmol/L (ref 135–145)

## 2014-04-18 LAB — IRON AND TIBC
Iron: <10 ug/dL — ABNORMAL LOW (ref 42–165)
UIBC: 94 ug/dL — ABNORMAL LOW (ref 125–400)

## 2014-04-18 LAB — MAGNESIUM: Magnesium: 1.7 mg/dL (ref 1.5–2.5)

## 2014-04-19 LAB — FERRITIN: Ferritin: 2293 ng/mL — ABNORMAL HIGH (ref 22–322)

## 2014-04-21 DIAGNOSIS — J9600 Acute respiratory failure, unspecified whether with hypoxia or hypercapnia: Secondary | ICD-10-CM

## 2014-04-21 LAB — BASIC METABOLIC PANEL
Anion gap: 8 (ref 5–15)
BUN: 38 mg/dL — ABNORMAL HIGH (ref 6–23)
CHLORIDE: 85 mmol/L — AB (ref 96–112)
CO2: 49 mmol/L (ref 19–32)
Calcium: 8.9 mg/dL (ref 8.4–10.5)
Creatinine, Ser: 0.57 mg/dL (ref 0.50–1.35)
GFR calc Af Amer: >90 mL/min (ref >90)
GLUCOSE: 198 mg/dL — AB (ref 70–99)
POTASSIUM: 4.2 mmol/L (ref 3.5–5.1)
Sodium: 142 mmol/L (ref 135–145)

## 2014-04-21 LAB — CBC WITH DIFFERENTIAL/PLATELET
Basophils Absolute: 0 10*3/uL (ref 0.0–0.1)
Basophils Relative: 0 % (ref 0–1)
EOS PCT: 0 % (ref 0–5)
Eosinophils Absolute: 0 10*3/uL (ref 0.0–0.7)
HCT: 23.6 % — ABNORMAL LOW (ref 39.0–52.0)
Hemoglobin: 7 g/dL — ABNORMAL LOW (ref 13.0–17.0)
LYMPHS ABS: 0.6 10*3/uL — AB (ref 0.7–4.0)
Lymphocytes Relative: 5 % — ABNORMAL LOW (ref 12–46)
MCH: 29.2 pg (ref 26.0–34.0)
MCHC: 29.7 g/dL — AB (ref 30.0–36.0)
MCV: 98.3 fL (ref 78.0–100.0)
MONO ABS: 0.1 10*3/uL (ref 0.1–1.0)
Monocytes Relative: 1 % — ABNORMAL LOW (ref 3–12)
Neutro Abs: 11.8 10*3/uL — ABNORMAL HIGH (ref 1.7–7.7)
Neutrophils Relative %: 94 % — ABNORMAL HIGH (ref 43–77)
Platelets: 100 10*3/uL — ABNORMAL LOW (ref 150–400)
RBC: 2.4 MIL/uL — ABNORMAL LOW (ref 4.22–5.81)
RDW: 17.8 % — ABNORMAL HIGH (ref 11.5–15.5)
WBC: 12.5 10*3/uL — ABNORMAL HIGH (ref 4.0–10.5)

## 2014-04-21 LAB — BRAIN NATRIURETIC PEPTIDE: B NATRIURETIC PEPTIDE 5: 179 pg/mL — AB (ref 0.0–100.0)

## 2014-04-21 LAB — CBC
HCT: 23.6 % — ABNORMAL LOW (ref 39.0–52.0)
Hemoglobin: 7 g/dL — ABNORMAL LOW (ref 13.0–17.0)
MCH: 29.5 pg (ref 26.0–34.0)
MCHC: 29.7 g/dL — ABNORMAL LOW (ref 30.0–36.0)
MCV: 99.6 fL (ref 78.0–100.0)
PLATELETS: 109 10*3/uL — AB (ref 150–400)
RBC: 2.37 MIL/uL — ABNORMAL LOW (ref 4.22–5.81)
RDW: 18 % — AB (ref 11.5–15.5)
WBC: 10.9 10*3/uL — AB (ref 4.0–10.5)

## 2014-04-21 LAB — PREPARE RBC (CROSSMATCH)

## 2014-04-21 NOTE — Progress Notes (Signed)
  Echocardiogram 2D Echocardiogram has been performed.  Arvil ChacoFoster, Sajad Glander 04/21/2014, 4:55 PM

## 2014-04-22 LAB — CBC
HCT: 27.3 % — ABNORMAL LOW (ref 39.0–52.0)
Hemoglobin: 8.4 g/dL — ABNORMAL LOW (ref 13.0–17.0)
MCH: 29.2 pg (ref 26.0–34.0)
MCHC: 30.8 g/dL (ref 30.0–36.0)
MCV: 94.8 fL (ref 78.0–100.0)
PLATELETS: 103 10*3/uL — AB (ref 150–400)
RBC: 2.88 MIL/uL — ABNORMAL LOW (ref 4.22–5.81)
RDW: 19.4 % — AB (ref 11.5–15.5)
WBC: 12.6 10*3/uL — AB (ref 4.0–10.5)

## 2014-04-22 LAB — TYPE AND SCREEN
ABO/RH(D): A POS
Antibody Screen: NEGATIVE
UNIT DIVISION: 0
Unit division: 0

## 2014-04-23 LAB — BLOOD GAS, ARTERIAL
Acid-Base Excess: 21.2 mmol/L — ABNORMAL HIGH (ref 0.0–2.0)
Bicarbonate: 47.5 mEq/L — ABNORMAL HIGH (ref 20.0–24.0)
FIO2: 0.8 %
O2 Saturation: 99.1 %
PATIENT TEMPERATURE: 98.6
PEEP/CPAP: 5 cmH2O
PH ART: 7.4 (ref 7.350–7.450)
PO2 ART: 171 mmHg — AB (ref 80.0–100.0)
RATE: 24 resp/min
TCO2: 49.9 mmol/L (ref 0–100)
VT: 500 mL
pCO2 arterial: 78.2 mmHg (ref 35.0–45.0)

## 2014-05-01 DEATH — deceased

## 2014-05-22 NOTE — H&P (Signed)
DATE OF BIRTH:  Mar 27, 1950  DATE OF ADMISSION:  05/11/2012  PRIMARY CARE PHYSICIAN:  Dr. Maryellen PileEason  REFERRING PHYSICIAN: Dr. Margarita GrizzleWoodruff  CHIEF COMPLAINT: Shortness of breath for several months, and worsening today.   HISTORY OF PRESENT ILLNESS: A 64 year old Caucasian male with a history of hypertension, diabetes, COPD, CHF, morbid obesity, OSA, who presented to the ED with shortness of breath for several months, and worsening today. The patient is alert, awake, oriented, in no acute distress, on BiPAP now. According to the patient and patient's son, patient has shortness of breath for months, on home oxygen p.r.n., but the symptoms are getting worse today. The patient has a lot of cough without sputum. No wheezing, but the patient has respiratory distress, leg edema and weight gain recently. The patient denies any chest pain, palpitations, orthopnea or nocturnal dyspnea, but has leg edema. The patient's O2 saturation dropped to 70% in the ED, and has been treated with BiPAP, nebulizer, Lasix and Levaquin. Symptoms are getting better.    PAST MEDICAL HISTORY: Hypertension, diabetes, CHF, COPD, OSA, morbid obesity, chronic respiratory failure, on home oxygen p.r.n.   SOCIAL HISTORY: The patient smokes 1 pack to 1.5 pack per day for more than 40 years. Denies any drinking or illicit drugs.   PAST SURGICAL HISTORY:  Hernia repair, bilateral knee surgery.   FAMILY HISTORY:  Significant for hypertension, CAD, diabetes, obesity.   ALLERGIES:  ANDROGEL.  HOME MEDICATIONS:   1.  Symbicort 80 mcg/4.5 mcg inhalation 2 puffs b.i.d.  2.  Spiriva 18 mcg inhalation 1 cap a day.  3.  ProAir 2 puffs inhaled 4 times a day.  4.  Pramipexole 0.5 mg p.o. daily at bedtime.  5.  Klor-Con 20 mEq p.o. daily.  6.  Glimepiride 4 mg p.o. once a day.  7.  Lasix 40 mg p.o. daily.   REVIEW OF SYSTEMS:  CONSTITUTIONAL:  The patient had fever and chills yesterday, but no headache or dizziness. No weakness, but had  weight gain recently.  EYES:  No double vision or blurry vision.  EARS, NOSE, THROAT:  No postnasal drip, slurred speech or dysphagia. No epistaxis.  CARDIOVASCULAR:  No chest pain, palpitation, orthopnea or nocturnal dyspnea, but has leg edema.  PULMONARY: Positive for cough and shortness of breath, but no sputum, hematemesis. No wheezing.  GASTROINTESTINAL: No abdominal pain, nausea, vomiting or diarrhea. No melena or bloody stool.  GENITOURINARY:  No dysuria, hematuria or incontinence, but has polyuria.  SKIN:  No rash or jaundice.  ENDOCRINOLOGY:  Positive for polyuria, but no heat or cold intolerance.  HEMATOLOGY:  No easy bruising, bleeding.  NEUROLOGY:  No syncope, loss of consciousness or seizure.   VITAL SIGNS: Temperature 99.2, blood pressure 162/82, pulse initially 125, and then decreased to 104, O2 saturation now 95% on BiPAP, respiration rate 34.   PHYSICAL EXAMINATION: GENERAL: The patient is alert, awake, oriented, on BiPAP, in no acute distress.  HEENT: Pupils round, equal, react to light and accommodation. NECK:  Supple. No JVD no carotid bruits. No lymphadenopathy. No thyromegaly.  CARDIOVASCULAR:  S1, S2. Regular rate and rhythm. No murmurs or gallops.  PULMONARY: Bilateral air entry. Very weak breath sounds. Crackles on the right side. Mild use of accessory muscles to breathe.  ABDOMEN: Obese, soft. Bowel sounds present. No distention or tenderness. No obvious organomegaly.  SKIN:  No rash or jaundice.  EXTREMITIES: Bilateral leg edema, 2 to 3+, with erythema, which is chronic, but no tenderness. No clubbing or cyanosis. No calf  tenderness. Difficult to palpate pedal pulses.  NEUROLOGY: A and O x 3. No focal deficit. Power 5/5. Sensation intact.   LABORATORY DATA:  WBC 13, hemoglobin 16.2, platelets 246. Glucose 123, BNP 1682, BUN 13, creatinine 0.78, sodium 131, potassium 4.4, chloride 92, bicarb 34. Chest x-ray:  Still concerning pneumonia in the right lower lung. CK  188, CK-MB 3.6. Troponin 0.02. EKG shows sinus tachycardia at 121 BPM with right axis deviation, pulmonary disease pattern.     IMPRESSIONS: 1.  Acute on chronic respiratory failure.  2.  Acute on chronic congestive heart failure.  3.  Systemic inflammatory response syndrome. 4.  Pneumonia.  5.  Hyponatremia.  6.  Chronic obstructive pulmonary disease.  7.  Hypertension.  8.  Diabetes.  9.  Obstructive sleep apnea.  10.  Morbid obesity.  11.  Tobacco abuse.   PLAN OF TREATMENT:   1.  The patient will be admitted to telemetry floor. Will continue tele monitor, continue BiPAP. Will give Xopenex and continue Spiriva, Symbicort. Will continue to monitor O2 saturation.   2. For acute on chronic CHF, will increase Lasix to 40 mg IV b.i.d. Continue potassium, follow BMP. In addition, we will get an echocardiogram to evaluate cardiac function.   3.  For pneumonia, we will continue Levaquin and follow blood culture, sputum culture and CBC.   4.  For  diabetes, we will start a sliding scale and check hemoglobin A1c.    5.  For tobacco abuse, I counseled patient about smoking cessation for 5 minutes. Will give a nicotine patch.   6.  GI and DVT prophylaxis.   Discussed patient's condition and plan of treatment with patient and the patient's son.   Patient is a FULL CODE.   TIME SPENT:  About 63 minutes.   ____________________________ Shaune Pollack, MD qc:mr D: 05/11/2012 17:54:56 ET T: 05/11/2012 19:22:41 ET JOB#: [id number]  cc: Shaune Pollack, MD, <Dictator> Shaune Pollack MD ELECTRONICALLY SIGNED 05/12/2012 17:33

## 2014-05-22 NOTE — Discharge Summary (Signed)
PATIENT NAME:  Robert Mccarthy, Robert R MR#:  [id number] DATE Robert BIRTH:  06-24-1950  DATE Robert ADMISSION:  05/11/2012 DATE Robert DISCHARGE:  05/13/2012  PRIMARY CARE PHYSICIAN: Robert CookeyErnest Eason, Robert Mccarthy    PRIMARY PULMONOLOGIST: Robert PaxSaadat A. Khan, Robert Mccarthy  DISCHARGE DIAGNOSES: 1. Right lower lobe pneumonia.  2. Sepsis present on admission.  3. Acute on chronic systolic congestive heart failure.  4. Cardiomyopathy with ejection fraction Robert 25%.  5. Acute on chronic respiratory failure.  6. Mild chronic obstructive pulmonary disease exacerbation.   IMAGING STUDIES: Included a chest x-ray which showed right lower lobe infiltrate and pulmonary edema.   CONSULTS: None.   PROCEDURES: Echocardiogram which showed ejection fraction 25%.   ADMITTING HISTORY AND PHYSICAL: Please see detailed H and P dictated on 05/11/2012.  In brief, this 64 year old Caucasian male patient was admitted for shortness Robert breath which was worsening along with some orthopnea. The patient had right lower lobe infiltrate on chest x-ray.   HOSPITAL COURSE:  Acute on chronic respiratory failure secondary to acute on chronic systolic congestive heart failure and right lower lobe pneumonia: The patient was treated with IV antibiotics, nebulizers and oxygen support. The patient was also on IV Lasix during the hospital stay, diuresed well, and returned back to normal. The patient had a 2-D echocardiogram done which showed significant cardiomyopathy with ejection fraction 25%. He was started on an ACE inhibitor, beta blocker and has been set up an appointment to follow up with Robert Mccarthy Robert Mccarthy as outpatient. The patient will also follow up with Dr. Welton FlakesKhan Robert pulmonology.   On the day Robert discharge, the patient is back to baseline at 94% on 2 liters Robert oxygen, is breathing better, is afebrile. Edema is significantly improved on exam, and he was discharged home in fair condition.   DISCHARGE MEDICATIONS: 1. ProAir HFA 2 puffs inhaled 4 times a day.   2. Spiriva 18 mcg inhaled once a day.  3. Potassium chloride 20 mEq oral once a day.  4. Pramipexole 0.5 mg oral once a day.  5. Glimepiride 4 mg oral once a day.  6. Symbicort 84.5, 2 puffs inhaled 2 times a day.  7. Lasix 40 mg oral 2 times a day.  8. Levaquin 500 mg oral once a day for 5 days.  9. Prednisone 60 mg tapered over 6 days.  10. Combivent 1 puff inhaled 4 times a day.  11. Toprol-XL 12.5 mg oral once a day.   DISCHARGE INSTRUCTIONS:  1. Low fat, low cholesterol, carbohydrate-controlled diet.  2. Activity as tolerated.  3. Follow up with Robert Mccarthy, Robert Mccarthy, in 1 to 2 weeks and primary care physician, Dr. Maryellen PileEason, in 1 to 2 weeks.  4. The patient has been given instructions on heart failure and salt restriction.   TIME SPENT: On day Robert discharge in discharge activity was 51 minutes.  ____________________________ Molinda BailiffSrikar R. Sonnie Bias, Robert Mccarthy srs:cb D: 05/14/2012 15:07:47 ET T: 05/14/2012 16:49:07 ET JOB#: [id number]  cc: Wardell HeathSrikar R. Elpidio AnisSudini, Robert Mccarthy, <Dictator> Antonieta Ibaimothy J. Gollan, Robert Mccarthy Serita ShellerErnest B. Maryellen PileEason, Robert Mccarthy Orie FishermanSRIKAR R Lennon Richins Robert Mccarthy ELECTRONICALLY SIGNED 05/16/2012 12:59

## 2014-05-23 NOTE — Consult Note (Signed)
Details:   - Patient s/p tracheostomy tube with Shiley XLT Size 8 proximal extension.  OK to discontinue sutures at POD #5.  Please keep obturator and spare tracheostomy at bedside.  Patient at risk of bleeding due to heparin, prefer at least 24 hours off heparin drip if possible but defer to medicine regarding PE/Afib.   Electronic Signatures: Lorence Nagengast, Rayfield Citizenreighton Charles (MD)  (Signed 30-Dec-15 07:17)  Authored: Details   Last Updated: 30-Dec-15 07:17 by Flossie DibbleVaught, Kayven Aldaco Charles (MD)

## 2014-05-23 NOTE — Discharge Summary (Signed)
PATIENT NAME:  Robert Mccarthy, Robert Mccarthy MR#:  [id number] DATE OF BIRTH:  September 17, 1950  DATE OF ADMISSION:  10/21/2013 DATE OF DISCHARGE:  10/23/2013  ADMISSION DIAGNOSES: 1. Acute on chronic respiratory failure, multifactorial, due to congestive heart failure exacerbation and chronic obstructive pulmonary disease exacerbation.  2. Congestive heart failure exacerbation.  3. Chronic obstructive pulmonary disease exacerbation.  4. History of obstructive sleep apnea on CPAP.  5. Obesity.   CONSULTATIONS: Cardiology.   DISCHARGE LABORATORIES: Sodium 137, potassium 4.1, chloride 92, bicarbonate 44, BUN 15, creatinine 0.81, glucose is 186.   Troponins were negative.   A 2-D echocardiogram is pending.   Hemoglobin A1c is 5.8.   TSH 0.69.   HOSPITAL COURSE: A very pleasant, 64 year old male, with a history of OSA on CPAP, as well as systolic heart failure, who presented with acute onset of respiratory failure. For further details, please see history and physical.  1. Acute on chronic respiratory failure, multifactorial, from acute on chronic congestive heart failure exacerbation, as well. His COPD exacerbation is outlined below.  2. Acute exacerbation of COPD. The patient presented with wheezing, increased O2 requirement. He now has no wheezing on examination with good air flow. He was weaned to his home oxygen, which is 2 to 3 liters. He was placed on IV steroids, which was transitioned to p.o. steroids. He was continued on nebulizers and inhalers, and from a COPD exacerbation point of view, is resolving. 3. Acute on chronic systolic heart failure. We appreciate cardiology consultation. He actually did have an echocardiogram, but the results are still pending. I spoke with cardiology. They recommended followup as an outpatient for the echocardiogram results and CHF. He has a known history of systolic heart failure. He was treated with IV Lasix. He is negative 5.5 liters today. We changed him from IV to  p.o. Lasix. Creatinine remained stable. Sodium level improved. He will follow up with Dr. Kirke Corin in a week. I did also consult the CHF clinic.  4. OSA on CPAP, which he used here, as well.  5. Obesity. Encouraged weight loss, as tolerated.  6. History of CAD. The patient was continued on metoprolol, lisinopril and his ARB, eplerenone.  7. Diabetes. The patient will continue his outpatient medications. According to the hemoglobin A1c here, his diabetes is well controlled. 8. Tobacco dependence. The patient was counseled on admission by the admitting physician. He will be discharged with his patch, which he is already taking at home.  9. Wound to the bottom of the foot. The patient had a wound present on admission on the bottom of his foot, no evidence of abscess or osteomyelitis. We did have wound care see the patient while the patient was here, and he will continue with treatment regimen, as per wound consult.   DISCHARGE MEDICATIONS:  1. Glimepiride 4 mg daily.  2. Metoprolol 50 mg daily. 3. Lisinopril 40 mg daily.  4. Eplerenone 25 mg daily.  5. Nicotine patch 21 mg per 24 hours.  6. Advair 250/50 b.i.d.  7. Acetaminophen/hydrocodone 325/5 q.6 h. p.r.n. pain.  8. Metformin 500 mg 2 tablets b.i.d.  9. Pramipexole 0.5 mg t.i.d. 10. ProAir 2 puffs  q.4 to 6h. p.r.n.  11. Spiriva 18 mcg daily.  12. Prednisone taper starting at 50 mg, taper by 10 mg every 2 days.  13. Lasix 40 mg daily.  14. Levaquin 500 mg daily for 3 days.   DISCHARGE INSTRUCTIONS: Discharge home with home health, physical therapy and nurse.   DISCHARGE DRESSING; Cleanse  ulcer to right great toe with normal saline and pat gently dry, lightly fill the wound with iodoform packing strip.   DISCHARGE OXYGEN: 2 to 3 liters nasal cannula.   DISCHARGE DIET: Low sodium.   DISCHARGE ACTIVITY: As tolerated.   DISCHARGE REFERRAL: CHF clinic.   DISCHARGE FOLLOW-UP: The patient will follow up with Dr. Kirke Mccarthy in 1 week.   TIME  SPENT: Approximately 35 minutes.   The patient is stable for discharge. Plan of care was discussed with the patient.   ____________________________ Bertie Simien P. Juliene PinaMody, MD spm:JT D: 10/23/2013 11:41:58 ET T: 10/23/2013 12:16:47 ET JOB#: [id number]  cc: Analiese Krupka P. Juliene PinaMody, MD, <Dictator> Robert A. Robert CorinArida, MD Janyth ContesSITAL P Markeria Goetsch MD ELECTRONICALLY SIGNED 10/23/2013 13:10

## 2014-05-23 NOTE — Consult Note (Signed)
Pt seen earlier by Dr. Shelle Ironein for possible G tube placement. Pt was just diagnosed with massive PE. Therefore, decision was made to hold off.  Pt has been consulted by ENT, who plans trach tomorrow at 6 PM. To stop heparin drip tonight at [place]. Has been getting TF via NG without any problems. Will also stop TF tonight at [place]. Will try to schedule PEG sometime tomorrow. Pt does have a big abdomen with body rash, which makes it difficult to place in correct position. If G tube cannot be placed, then will need surgical G tube placement. Thanks.  Electronic Signatures: Lutricia Feilh, Kaius Daino (MD)  (Signed on 28-Dec-15 17:11)  Authored  Last Updated: 28-Dec-15 17:11 by Lutricia Feilh, Delvin Hedeen (MD)

## 2014-05-23 NOTE — Consult Note (Signed)
Patient scheduled for tracheostomy tube tomorrow morning.  Please hold anticoagulation at midnight and check labs in a.m. to ensure correction of ptt.  Discussed procedure with patient's wife who agrees with procedure and understands post-operative care and risks of procedure.  Electronic Signatures: Carston Riedl, Rayfield Citizenreighton Charles (MD)  (Signed on 23-Dec-15 12:17)  Authored  Last Updated: 23-Dec-15 12:17 by Flossie DibbleVaught, Arvin Abello Charles (MD)

## 2014-05-23 NOTE — Consult Note (Signed)
Admit Diagnosis:   PNEUMONIA: Onset Date: 02-Jan-2014, Status: Active, Description: PNEUMONIA      Admit Reason:   Cellulitis of right lower extremity (L03.115): Status: Active, Coding System: ICD10, Coded Name: Cellulitis of right lower limb   Pneumonia (J18.9): Status: Active, Coding System: ICD10, Coded Name: Pneumonia, unspecified organism      DME/HH/HomeO2:   Home Oxygen/apria (3): Onset Date: 29-Oct-2006, Status: Active, Coding System: [facility], Coded Name: Home Oxygen - Please enter Company name and contact info    Multi-drug Resistant Organism (MDRO): Positive culture for MRSA., 31-Jul-2013  Home Medications: Medication Instructions Status  furosemide 40 mg oral tablet 1 tab(s) orally once a day Active  glimepiride 4 mg oral tablet 1 tab(s) orally once a day (in the morning) Active  metoprolol succinate 50 mg oral tablet, extended release 1 tab(s) orally once a day Active  eplerenone 25 mg oral tablet 1 tab(s) orally once a day Active  ProAir HFA CFC free 90 mcg/inh inhalation aerosol 2 puff(s) inhaled every 4 to 6 hours, As Needed - for Shortness of Breath Active  Spiriva 18 mcg inhalation capsule 1 cap(s) inhaled once a day Active  arformoterol 15 mcg/2 mL inhalation solution 2 milliliter(s) inhaled 2 times a day Active  aspirin 81 mg oral tablet 1 tab(s) orally once a day Active  lisinopril 40 mg oral tablet 1 tab(s) orally once a day Active  pramipexole 1 mg oral tablet 1 tab(s) orally 2 times a day Active   Lab Results: Routine Hem:  04-Dec-15 06:30   Platelet Count (CBC)  108   Radiology Results:  Radiology Results: LabUnknown:    02-Jul-15 15:00, Wound Aerobic/Anaerobic Culture  Ind. Clindamycin Resistance    AndroGel 2.5 g/packet: SOB, Swelling  Nursing Flowsheets: **Vital Signs.:   04-Dec-15 06:04  Temperature Temperature (F) 98.4  Respirations Respirations 20    General Aspect Pt admitted for sob and possible sepsis.  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  Is s/p debridment of joint.   Case History and Physical Exam:  Cardiovascular Non palpable pulses.  CFT brisk.  Warm skin.   Musculoskeletal diffuse edema b/l   Neurological Neuropathic   Skin Small plantar right 1st mtpj wound with scant clear drainage.  No purulence.  Measure 3mm to subcutaneous tissue.  Diffuse cellulits to right lower leg.    Impression Cellulitis with right great toe ulceration.   Plan C/W current antibiotics. Should f/u with podiatry outpt. No acute need for surgical debridment. WB to heel.   Dressing changes have been written and agree with these.   Electronic Signatures: Gwyneth RevelsFowler, Amaiya Scruton (MD)  (Signed 04-Dec-15 10:23)  Authored: Health Issues, Significant Events - History, Home Medications, Labs, Radiology Results, Allergies, Vital Signs, General Aspect/Present Illness, History and Physical Exam, Impression/Plan   Last Updated: 04-Dec-15 10:23 by Gwyneth RevelsFowler, Mcguire Gasparyan (MD)

## 2014-05-23 NOTE — Consult Note (Signed)
Chief Complaint:  Subjective/Chief Complaint s/p Trach last night. G site looks good with no bleeding.   VITAL SIGNS/ANCILLARY NOTES: **Vital Signs.:   30-Dec-15 07:00  Vital Signs Type Routine  Temperature Temperature (F) 99.5  Celsius 37.5  Pulse Pulse 113  Respirations Respirations 26  Systolic BP Systolic BP 301  Diastolic BP (mmHg) Diastolic BP (mmHg) 84  Mean BP 102  Pulse Ox % Pulse Ox % 94  Pulse Ox Activity Level  At rest  Oxygen Delivery Ventilator Assisted   Brief Assessment:  GEN no acute distress   Cardiac Regular   Respiratory clear BS   Gastrointestinal Normal  G site intact   Lab Results: Hepatic:  29-Dec-15 03:49   SGOT (AST) 23 (Result(s) reported on 27 Jan 2014 at 05:26AM.)  Alkaline Phosphatase 72 (46-116 NOTE: New Reference Range 08/19/13)  Albumin, Serum  2.0  Total Protein, Serum 6.8 (Result(s) reported on 27 Jan 2014 at 05:26AM.)  30-Dec-15 04:20   SGOT (AST) 27 (Result(s) reported on 28 Jan 2014 at 05:17AM.)  Alkaline Phosphatase 82 (46-116 NOTE: New Reference Range 08/19/13)  Albumin, Serum  2.1  Total Protein, Serum 7.1 (Result(s) reported on 28 Jan 2014 at 05:17AM.)  General Ref:  29-Dec-15 03:49   Prealbumin ========== TEST NAME ==========  ========= RESULTS =========  = REFERENCE RANGE =  TPN PANEL  Prealbumin Prealbumin                      [   17 mg/dL             ]              [address]               [facility]            No: [id number]           [address] [place], [place] [address]           Lindon Romp, MD         [phone]   Result(s) reported on 28 Jan 2014 at 06:18AM.  30-Dec-15 04:20   Prealbumin PREALBUMIN   Result(s) reported on 28 Jan 2014 at 05:17AM.  Routine Chem:  29-Dec-15 03:49   Phosphorus, Serum 3.3  Magnesium, Serum  1.4 (1.8-2.4 THERAPEUTIC RANGE: 4-7 mg/dL TOXIC: > 10 mg/dL  -----------------------)  Cholesterol, Serum 172 (Result(s) reported on 27 Jan 2014 at 05:26AM.)   Triglycerides, Serum 105 (Result(s) reported on 27 Jan 2014 at 05:26AM.)  Glucose, Serum  130  BUN  22  Creatinine (comp)  0.54  Sodium, Serum  129  Potassium, Serum  5.9  Chloride, Serum  91  CO2, Serum  36  Anion Gap  2  Osmolality (calc) 264  Calcium (Total), Serum  10.4  eGFR (African American) >60  eGFR (Non-African American) >60 (eGFR values <94m/min/1.73 m2 may be an indication of chronic kidney disease (CKD). Calculated eGFR, using the MRDR Study equation, is useful in  patients with stable renal function. The eGFR calculation will not be reliable in acutely ill patients when serum creatinine is changing rapidly. It is not useful in patients on dialysis. The eGFR calculation may not be applicable to patients at the low and high extremes of body sizes, pregnant women, and vegetarians.)  Result Comment ANTI-XA - RESULTS VERIFIED BY REPEAT TESTING.  Result(s) reported on 27 Jan 2014 at 05:41AM.  30-Dec-15 04:20   Phosphorus, Serum 2.9  Magnesium, Serum  1.2 (1.8-2.4 THERAPEUTIC RANGE: 4-7 mg/dL TOXIC: > 10 mg/dL  -----------------------)  Cholesterol, Serum 188 (Result(s) reported on 28 Jan 2014 at 05:17AM.)  Triglycerides, Serum 120 (Result(s) reported on 28 Jan 2014 at 05:17AM.)  Glucose, Serum  165  BUN  19  Creatinine (comp)  0.59  Sodium, Serum  128  Potassium, Serum  5.6  Chloride, Serum  89  CO2, Serum  35  Anion Gap  4  Osmolality (calc) 263  Calcium (Total), Serum  10.4  eGFR (African American) >60  eGFR (Non-African American) >60 (eGFR values <49m/min/1.73 m2 may be an indication of chronic kidney disease (CKD). Calculated eGFR, using the MRDR Study equation, is useful in  patients with stable renal function. The eGFR calculation will not be reliable in acutely ill patients when serum creatinine is changing rapidly. It is not useful in patients on dialysis. The eGFR calculation may not be applicable to patients at the low and high extremes of body  sizes, pregnant women, and vegetarians.)  Result Comment LABS - This specimen was collected through an   - indwelling catheter or arterial line.  - A minimum of 545m of blood was wasted prior    - to collecting the sample.  Interpret  - results with caution.  Result(s) reported on 28 Jan 2014 at 05:17AM.  Routine Coag:  29-Dec-15 03:49   Prothrombin 12.7  INR 1.0 (INR reference interval applies to patients on anticoagulant therapy. A single INR therapeutic range for coumarins is not optimal for all indications; however, the suggested range for most indications is 2.0 - 3.0. Exceptions to the INR Reference Range may include: Prosthetic heart valves, acute myocardial infarction,prevention of myocardial infarction, and combinations of aspirin and anticoagulant. The need for a higher or lower target INR must be assessed individually. Reference: The Pharmacology and Management of the Vitamin K  antagonists: the seventh ACCP Conference on Antithrombotic and Thrombolytic Therapy. ChIOMBT.5974ept:126 (3suppl): 20N9146842A HCT value >55% may artifactually increase the PT.  In one study,  the increase was an average of 25%. Reference:  "Effect on Routine and Special Coagulation Testing Values of Citrate Anticoagulant Adjustment in Patients with High HCT Values." American Journal of Clinical Pathology 2006;126:400-405.)  Activated PTT (APTT) 31.1 (A HCT value >55% may artifactually increase the APTT. In one study, the increase was an average of 19%. Reference: "Effect on Routine and Special Coagulation Testing Values of Citrate Anticoagulant Adjustment in Patients with High HCT Values." American Journal of Clinical Pathology 2006;126:400-405.)  30-Dec-15 04:20   Prothrombin 13.0  INR 1.0 (INR reference interval applies to patients on anticoagulant therapy. A single INR therapeutic range for coumarins is not optimal for all indications; however, the suggested range for most indications  is 2.0 - 3.0. Exceptions to the INR Reference Range may include: Prosthetic heart valves, acute myocardial infarction, prevention of myocardial infarction, and combinations of aspirin and anticoagulant. The need for a higher or lower target INR must be assessed individually. Reference: The Pharmacology and Management of the Vitamin K  antagonists: the seventh ACCP Conference on Antithrombotic and Thrombolytic Therapy. ChBULAG.5364ept:126 (3suppl): 20N9146842A HCT value >55% may artifactually increase the PT.  In one study,  the increase was an average of 25%. Reference:  "Effect on Routine and Special Coagulation Testing Values of Citrate Anticoagulant Adjustment in Patients with High HCT Values." American Journal of Clinical Pathology 2006;126:400-405.)  Activated PTT (APTT) 27.9 (A HCT value >55% may artifactually increase the APTT. In one study, the increase was  an average of 19%. Reference: "Effect on Routine and Special Coagulation Testing Values of Citrate Anticoagulant Adjustment in Patients with High HCT Values." American Journal of Clinical Pathology 2006;126:400-405.)  Routine Hem:  29-Dec-15 03:49   WBC (CBC) 7.8 (Result(s) reported on 27 Jan 2014 at 05:14AM.)  Hemoglobin (CBC) 14.2 (Result(s) reported on 27 Jan 2014 at 05:14AM.)  Platelet Count (CBC) 176 (Result(s) reported on 27 Jan 2014 at 05:14AM.)  30-Dec-15 04:20   WBC (CBC) 9.3  Hemoglobin (CBC) 15.0 (Result(s) reported on 28 Jan 2014 at 05:17AM.)  Platelet Count (CBC)  148 (Result(s) reported on 28 Jan 2014 at 05:17AM.)   Assessment/Plan:  Assessment/Plan:  Assessment S/P G tube placement.   Plan Ok to start TF today. Watch for bleeding when heparin resumed. Will sign off. Thanks   Electronic Signatures: Verdie Shire (MD)  (Signed 30-Dec-15 08:05)  Authored: Chief Complaint, VITAL SIGNS/ANCILLARY NOTES, Brief Assessment, Lab Results, Assessment/Plan   Last Updated: 30-Dec-15 08:05 by Verdie Shire (MD)

## 2014-05-23 NOTE — Op Note (Signed)
PATIENT NAME:  Robert Mccarthy, Robert Mccarthy MR#:  [id number] DATE OF BIRTH:  1950-09-17  DATE OF PROCEDURE:  08/28/2013  SURGEON: Ricci Barkerodd W. Orhan Mayorga, DPM   PREOPERATIVE DIAGNOSIS: Osteomyelitis, right great toe joint.   POSTOPERATIVE DIAGNOSIS: Osteomyelitis, right great toe joint.   PROCEDURE: Incision and drainage with excisional debridement of soft tissue and bone, right great toe joint.   ANESTHESIA: Local MAC.   HEMOSTASIS: Pneumatic tourniquet, right calf, 300 mmHg.   ESTIMATED BLOOD LOSS: 50 mL.   MATERIALS:  STIMULAN rapid cure beads impregnated with gentamicin and vancomycin.   DRAINS: None.   PATHOLOGY: Bone, right great toe joint.   COMPLICATIONS: None apparent.   OPERATIVE INDICATIONS: This is a 64 year old male with recent development of an ulceration causing hospitalization. The ulceration probe full thickness down to the level of bone and osteomyelitis was confirmed by MRI and decision was made for debridement of the infected soft tissue as well as the right great toe joint.   OPERATIVE PROCEDURE: The patient was taken to the operating room and placed on the table in the supine position. Following satisfactory sedation, the right foot was anesthetized with 10 mL of 0.5% Sensorcaine plain around the right 1st metatarsal. A pneumatic tourniquet was applied at the level of the right calf and the foot and leg were prepped and draped in the usual sterile fashion. The leg was exsanguinated and the tourniquet inflated to 250 mmHg.   Attention was then directed to the dorsal aspect of the right foot where an approximate 5 cm linear incision was made over the 1st metatarsal and metatarsophalangeal joint. The incision was deepened sharply down to the level of the joint.  As the capsule was incised, a large amount of purulent drainage was immediately expressed from the joint. The soft tissues were removed from surrounding the joint and the base of the proximal phalanx and head of the 1st  metatarsal were resected. The sesamoids were also resected with the joint and removed. At this point, the plantar ulceration was debrided sharply using a Rongeur and a VersaJet debrider on a setting of 7. The wound was debrided to full-thickness down to the superficial and deep subcutaneous tissues as well as the raw bony edges. There was noted to be good removal of all devitalized tissue and the wound was then copiously irrigated with pulsed lavage, 3 liters of saline solution. The dorsal incision was then closed using 4-0 nylon vertical mattress and simple interrupted sutures followed by implanting the [facility] antibiotic beads into the plantar wound. Several simple interrupted sutures were then placed through the plantar wound to reapproximate the edges with a small opening left in the central aspect. Xeroform and 4 x 4's and a sterile bandage were then applied followed by an Ace wrap. The tourniquet was released to the right leg. The patient tolerated the procedure and anesthesia well and was transported to the PACU with vital signs stable and in good condition.    ____________________________ Linus Galasodd Winifred Balogh, DPM tc:dd D: 07/31/2013 19:14:39 ET T: 08/01/2013 04:25:49 ET JOB#: [id number]  cc: Linus Galasodd Edrie Ehrich, DPM, <Dictator> Janifer Gieselman DPM ELECTRONICALLY SIGNED 08/02/2013 23:17

## 2014-05-23 NOTE — H&P (Signed)
PATIENT NAME:  Robert Mccarthy, Robert Mccarthy MR#:  [id number] DATE OF BIRTH:  1950/12/02  DATE OF ADMISSION:  07/29/2013  PRIMARY CARE PHYSICIAN: Dr. Maryellen Pile   PRIMARY CARDIOLOGIST: Dr. Daine Gip EMERGENCY ROOM PHYSICIAN: Dr. Margarita Grizzle CHIEF COMPLAINT:  Fall and foot infection.   HISTORY OF PRESENTING ILLNESS: A 64 year old male with history of hypertension, diabetes, CHF, COPD, obstructive sleep apnea, morbid obesity, chronic respiratory failure on home oxygen 2 liters who has some foot infection for almost a week now and gradually getting weaker but was not telling family. Yesterday, he just told his son that he is weak. But today, when he was trying to walk, he fell down on the floor and could not get up on his own, was extremely weak, and then family noticed that his right foot is red and swollen so they brought him to the Emergency Room. On arrival to the Emergency Room, he was found to have a fever up to 101.8 degrees Fahrenheit with tachycardia and respiratory distress so was started on oxygen and ABG was done which showed acidosis with CO2 retention. Started on BiPAP as patient was slightly drowsy. After starting this, sepsis work-up was done and started on broad-spectrum antibiotic and cultures were collected. The patient had slight improvement in mental status with BiPAP so given to hospitalist team as further management.   REVIEW OF SYSTEMS: CONSTITUTIONAL: Positive for fever and generalized weakness. No weight loss or weight gain.  EYES: No blurring, double vision, or discharge EARS, NOSE, AND THROAT:  No tinnitus, ear pain.  No hearing loss.  RESPIRATORY: No cough, wheezing, hemoptysis, but has shortness of breath.  CARDIOVASCULAR: No chest pain, orthopnea, edema, arrhythmia, palpitations.  GASTROINTESTINAL: No nausea, vomiting, diarrhea, abdominal pain.  GENITOURINARY: No dysuria, hematuria, or increased frequency.  ENDOCRINE: No heat or cold intolerance. No excessive sweating.  SKIN:  The patient has infection and swelling on right leg starting from foot up to the knee.  NEUROLOGICAL: No numbness, weakness, tremor, or vertigo.  PSYCHIATRIC: No anxiety, insomnia, bipolar disorder.  PAST MEDICAL HISTORY:  1. Hypertension.  2. Diabetes.  3. CHF. 4. COPD. 5. Obstructive sleep apnea. 6. Morbid obesity.  7. Chronic respiratory failure on home oxygen, 2 liters.  SOCIAL HISTORY: The patient smokes up to 2 packs every day for more than 40 years. He denies any drinking or illicit drug use.   PAST SURGICAL HISTORY: Hernia repair surgery, bilateral knee surgery.   FAMILY HISTORY: Significant for hypertension and coronary artery disease and obesity and diabetes in multiple family members.   HOME MEDICATIONS:  1. Spiriva 18 mcg once a day.  2. ProAir HFA 2 puffs inhalation 4 times a day.  3. Pramipexole 0.5 mg oral once a day.  4. Metoprolol 50 mg extended release once a day.  5. Lisinopril 40 mg oral tablet once a day.  6. Glimepiride 4 mg oral once a day.  7. Eplerenone 25 mg oral once a day.   VITAL SIGNS: In ER, temperature 101.8, pulse is 115, respirations 29, blood pressure 194/103, and pulse oximetry is 100 on oxygen supplementation with BiPAP.   PHYSICAL EXAMINATION: GENERAL: The patient is morbidly obese, appears to be in distress and critical because of respiratory distress. He is on BiPAP. He is alert and answering my questions.  HEENT: Head and neck atraumatic. Conjunctivae pink. BiPAP in use.  NECK: Supple. No JVD.  RESPIRATORY: Bilateral equal air entry.  Wheezing present. No crepitation.  CARDIOVASCULAR: S1, S2 present, regular. No murmur. ABDOMEN: Soft,  nontender. Bowel sounds present. No organomegaly.  SKIN: Right lower limb, on the sole of his foot, underneath 1st metatarsal head, there is a 2 cm x 2 cm stage IV ulcer with yellowish pus and dead skin around it and foul-smelling. Reddening of the skin with warmth and swelling up to 2-3 inches above the knee  on the right side, extending all the way from the feet.  LEGS: As mentioned above.  No edema on the left.  NEUROLOGICAL: Power 4/5. Moves all 4 limbs. Generalized weakness. No tremor, but has some involuntary movements in between and says he has restless leg syndrome.  PSYCHIATRIC: Does not appear in any acute psychiatric illness but is in distress because of acute problem.   IMPORTANT LABORATORY RESULTS: Glucose 124, BUN 21, creatinine 0.77, sodium 132, potassium is 5.3, chloride is 91, CO2 is 33, anion gap is 8, calcium is 10, and magnesium is 1.1.   Total protein 7.8, albumin 3.2, bilirubin 1.6, alkaline phosphatase 94, SGOT 21, and SGPT 22. Troponin is less than 0.02. Prothrombin is 14.7. INR is 1.2. On ABG, pH is 7.24, pCO2 is 80 and pO2 of 395, 100% FiO2.  IMAGING DATA:  Chest x-ray, portable, single view is done which shows cardiac enlargement with vascular congestion.   ASSESSMENT AND PLAN:  A 64 year old male, morbidly obese with diabetes, hypertension, hyperlipidemia, congestive heart failure, chronic obstructive pulmonary disease, and also sleep apnea, chronic oxygen use, came to hospital after having leg infection and swelling on the foot for last 1 week, gradually getting worse and found having sepsis.  1. Sepsis secondary to cellulitis. Blood culture and urine culture collected, started on vancomycin and Zosyn. We will continue that for now and we will call infectious disease and podiatry consult. He might need some debridement on his skin to help healing process.  2. Acute on chronic respiratory failure due to chronic obstructive pulmonary disease exacerbation secondary to infection. He is on 2 liters oxygen use at home. Currently requiring BiPAP. We will continue that and give IV Solu-Medrol, Spiriva, DuoNeb, antibiotics for cellulitis.  3. Diabetes. Using glimepiride at home. We will continue the same and we will put insulin sliding scale coverage as blood sugar might be out of control  because of steroid and infection.  4. Hypertension. Currently, blood pressure is stable. We would like to hold the medication because he has sepsis and might drop the blood pressure.  5. Morbid obesity and obstructive sleep apnea. He uses CPAP at home. Currently, he is on BiPAP.  We will put on stepdown unit and continue the same.  6. Hypomagnesemia.  We will replace it and check tomorrow morning.  CODE STATUS:  Full code.  Patient's healthcare power of attorney is his wife who is present in the room.  The patient is in critical condition and plan discussed with him and his family members and they understand and agree with that.  TOTAL TIME SPENT:  Critical care 60 minutes in this admission.    ____________________________ Hope PigeonVaibhavkumar G. Elisabeth PigeonVachhani, MD vgv:dd D: 07/29/2013 17:12:40 ET T: 07/29/2013 17:55:58 ET JOB#: [id number]  cc: Hope PigeonVaibhavkumar G. Elisabeth PigeonVachhani, MD, <Dictator> Altamese DillingVAIBHAVKUMAR VACHHANI MD ELECTRONICALLY SIGNED 07/29/2013 21:18

## 2014-05-23 NOTE — Discharge Summary (Signed)
PATIENT NAME:  Robert Mccarthy, Robert Mccarthy MR#:  [id number] DATE OF BIRTH:  [birth year]  DATE OF ADMISSION:  07/29/2013 DATE OF DISCHARGE:  08/04/2013  REASON FOR ADMISSION: Fall and foot infection.   DISCHARGE DIAGNOSES:  1. Sepsis.  2. Osteomyelitis.  3. Cellulitis of the right great toe joint. 4. Methicillin-resistant Staphylococcus aureus infection in the wounds.  5. Acute on chronic respiratory failure.  6. Chronic obstructive pulmonary disease exacerbation.  7. Oxygen use at home of 2 liters.  8. Type 2 insulin-dependent diabetes.  9. Hypertension.  10. Morbid obesity.  11. Sleep apnea.  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  13. Hypomagnesemia.   DISPOSITION: Skilled nursing facility for rehabilitation.   MEDICATIONS AT DISCHARGE:  1. ProAir HFA 2 puffs 4 times a day.  2. Spiriva 18 mcg once daily.  3. Glimepiride 4 mg once a day.   5. Metoprolol succinate 50 mg once daily.  6. Lisinopril 40 mg daily.. 8. Prednisone 30 mg once a day decrease 10 mg every day until gone.  9. Acetaminophen with hydrocodone 328/5 mg every 4 hours as needed for pain.  10. Heparin 5000 units subcutaneously every 8 hours.  11. Insulin aspart sliding scale start at 0 to 70 initiate hypoglycemia protocol, from 71 to 150 none or zero units, from 151 to 200 two units, from 201 to 250 give 4 units, from 251 to 300 give 6 units, from 301 to 350 give 8 units, from 351 to 400 ten units and call MD. 12. Levemir 9 units subcutaneously once a day. 13. Advair 250/50 twice daily. 14. Nystatin powder as needed for irritation in the groin.  15. Vancomycin 1500 mg every 8 hours for 42 days. Check vancomycin trough per protocol.  16. Protonix 40 mg daily.  17. Nicotine 21 mg once daily.  18. Ertapenem 1 gram IV every 24 hours for 42 days.   FOLLOWUP: Follow up Dr. Linus Galasodd Cline next 1 to 2 weeks, Dr. Clydie Braunavid Fitzgerald next 2 to 4 weeks. Follow up with Dr. Toy CookeyErnest Eason also in 2 to 4 weeks.   PROCEDURES: Debridement of  ulcer done by Dr. Alberteen Spindleline.   HOSPITAL COURSE: A 64 year old gentleman with history of hypertension, diabetes, CHF, COPD, obstructive sleep disorder, obstructive sleep apnea, morbid obesity, chronic respiratory failure on 2 liters of oxygen at home with a history of foot infection, diabetic foot ulcer for about a week prior to coming to the hospital.   The patient came to the Emergency Department to be evaluated for the right foot infection and his foot was red and swollen. He had a temperature on arrival of 101.8. He was tachycardic with some increase in respiratory distress for what he was put on BiPAP. His ulcer was evaluated by podiatry. Cultures were taken growing MRSA, and some possible anaerobes for what the patient was started on broad-spectrum antibiotics. Right now we switched his antibiotics to Zosyn and vancomycin to cover anaerobes and the patient is going to go to a skilled nursing facility with ertapenem and vancomycin for treatment of the multi bacterial infection.   AS FAR AS HIS PROBLEMS: 1. Sepsis. The patient has sepsis secondary to cellulitis and osteomyelitis. The patient had blood cultures, urine culture done and they were not positive on blood, only on the wound and the wound grew up possible anaerobes and some MRSA. The patient had a debridement of the foot ulcer, which was significant due to the significant purulent discharge and he had an MRI showing osteomyelitis. ID was consulted and they  recommended a treatment of 6 weeks of IV antibiotics with ertapenem and vancomycin for now. The patient overall has improvement. His sepsis, has resolved.  2. Acute on chronic respiratory failure. The patient requires oxygen at home on 2 liters. Right now we have him somewhere around 2 to 4 due to a chronic obstructive pulmonary disease exacerbation. The patient seems to be stable. We are trying to wean off the steroids. The patient required BiPAP, but at this moment, he being better controlled.   3. As far as his diabetes, has been out of control. The patient takes only oral medications at home. His blood sugars have been in the 300s occasionally. We started him on Levemir. Levemir was started on this patient to have a tighter control of his blood sugars and his blood sugars right now have been average below 100.  4. As far as his hypertension, seems to be slight increase whenever he has pain. At this moment, we are not going to do any major changes in his blood pressure regimen since the patient is starting to improve from his pain control.  5. As far as congestive heart failure, the patient has an ejection fraction of 35% systolic dysfunction, likely some degree ischemic. The patient has losartan and a beta blocker.  6. As far as his morbid obesity, the patient requires CPAP or BiPAP at night. Other than that the patient has been doing okay. No major complications during this hospitalization. The patient is okay going to a skilled nursing facility. Other than that, the patient is ready for being discharged.   I spent about 45 minutes with this patient's discharge.   FOLLOWUP: Follow up with Dr. Sampson Goon to monitor antibiotic level.    ____________________________ Felipa Furnace, MD rsg:lt D: 08/04/2013 11:55:00 ET T: 08/04/2013 22:16:14 ET JOB#: [id number]  cc: Felipa Furnace, MD, <Dictator> Stann Mainland. Sampson Goon, MD Linus Galas, DPM Serita Sheller. Maryellen Pile, MD  Regan Rakers Juanda Chance MD ELECTRONICALLY SIGNED 08/14/2013 1:49

## 2014-05-23 NOTE — Consult Note (Signed)
DATE OF SERVICE: 01/29/2014 SERVICE: CRITICAL CARE MEDICINE FOR CONSULTATION: GROSS HEMATURIA  OF PRESENT ILLNESS: Mr. Licausi is a 64 YO gentleman with a myriad of medical problems currently in the critical care unit for a number of significant medical issues including hypoxemic respiratory failure, acute on chronic COPD, acute on chronic CHF, and sepsis. He currently has a trach collar and a PEG tube and is unable to converse with me due to the sedation required to keep him comfortable. I am able to speak with his family which I did at length. Mr. Carelock has been in the hospital since early December mainly with pulmonary issues. A heparin drip was started during the hospitalization. He required placement of a foley early on for strict I/Os and for urinary retention. Over the last 24 hours, the staff has noted gross hematuria from the foley requiring bedside hand irrigation to displace clots. Urology was consulted by the primary service for assistance in management. Mr. Alms is in critical condition but appears comfortable sedated, with trach collar and PEG tube upon my evaluation at bedside.   PAST MEDICAL HISTORY: Hypertension, COPD, congestive heart failure, diabetes type 2, tobacco abuse, obstructive sleep apnea and obesity.  SURGICAL HISTORY: The patient had arthroscopic knee clean out, right foot debridement secondary to osteomyelitis and an inguinal hernia repair.  HISTORY: The patient smokes 1 pack per day and has so for the last 45 years. He denies drinking or any illicit drug use. He lives with his wife, who is his primary caretaker.  HISTORY: Significant for heart disease and diabetes throughout the family.   Arformoterol 15 mcg/2 mL inhaled solution 2 mL inhaled b.i.d.  Aspirin 81 mg 1 tab p.o. daily.  Eplerenone 25 mg 1 tablet p.o. daily.  Furosemide 40 mg 1 tablet p.o. daily.  Glimepiride 4 mg one tab p.o. q. a.m.   Lisinopril 40 mg 1 tab p.o. q. day.  Metoprolol succinate 50 mg  extended release tablet 1 tablet p.o. daily.  Pramipexole 1 mg one tab p.o. daily.  ProAir 90 mcg actuator inhaler 2 puffs as needed every 4 to 6 hours for shortness of breath.  Spiriva 18 mcg 1 capsule inhaled once daily.  ANDROGEL. OF SYSTEMS: trach collar, PEG tube, foley with blood tinged urine and medium size clots present in the catheter bag.  EXAMINATION: with trach collarNADSedatedIntubated with tracheostomy tube RRR on monitorobese, soft, nondistended, no guarding/reboundpatient is circumcised, testicles palpable bilaterally, penis is buried with excessive suprapubic fat pad present but the glans is able to be delivered. 16Fr foley present with blood tinged urine in bag as well as medium sized clotscellulitis present bilaterally SCr stable 0.5-0.6. No evidence of renal insufficiency.  64 YO gentleman with gross hematuria in the setting of heparin drip. Medium sized clots present with difficulty passing through 16Fr catheter requiring bedside irrigation by staff.  had a long discussion with Mr. Rathert family (as patient is sedated for comfort given trach and PEG tube) regarding his overall situation and his current new urologic issue of gross hematuria. A bedside ultrasound was performed demonstrating approximately 64m present in the patient's bladder. My overall impression is that he does NOT require CBI at this time but does need a higher calibre catheter to accomadate clot passage and ease of bedside irrigation. Regarding his gross hematuria, his family informs me that this was never an issue for Mr. EMerfeldin the past. Given the heparin drip, his multiple acute medical problems, his guarded critical care status, the family is  understandably conservative with acute interventions. I informed them of a complete hematuria workup encompassing a CT urogram and a cystoscopy, but the family declines these tests at this time. Should the patient make significant clinical improvement and should the  hematuria continue to be an issue, this can be readdressed in the future.  foley removed. After sterile prepping, 2% lidocaine jelly was adminstered into the urethra. A 24Fr Rouche 2-way hematuria catheter was then placed with ease into the patient's bladder with return of blood tinged urine. Patient was able to tolerate the procedure without any complications.  Cont foley per primary team. Change monthly until patient no longer requires. Should he need it chronically for extended months, he should see a urologist as an outpatient for consideration of a suprapubic catheter.  Patient does not need CBI at this time. 24Fr rouche catheter will accomdate clot passage and will allow staff to irrigate the catheter at the bedside with more ease.  Gross hematuria likely 2/2 to heparin drip and presence of foley. Will hold on formal hematuria workup for now given clincal context and family's wishes (see above). Should [place] be an ongoing issue and patient make dramatic clinical improvement, this can be readdressed as an outpatient with Dr. Hollice Espy of [place].  will follow. Please do not hesitate to contact the service with questions or concerns.  Sanda Linger, MDSurgery  Electronic Signatures: Lum Babe (MD)  (Signed on 31-Dec-15 17:01)  Authored  Last Updated: 31-Dec-15 17:01 by Lum Babe (MD)

## 2014-05-23 NOTE — Consult Note (Signed)
Details:   - Patient scheduled for trach tonight ~6p.m.  Please continue to hold tube feeds and heparin.   Electronic Signatures: Ulysess Witz, Rayfield Citizenreighton Charles (MD)  (Signed 29-Dec-15 07:38)  Authored: Details   Last Updated: 29-Dec-15 07:38 by Flossie DibbleVaught, Nia Nathaniel Charles (MD)

## 2014-05-23 NOTE — Consult Note (Signed)
Details:   - GI Note;  PEG placement requested.   Unfortunatley, he has just been diagnosed with bilateral PE and evidence of right heart strain concernng for submassive P.E.  The heparin would need to be held for 6 hours prior to the procedure and approx 24 hours post PEG.  Given the massive bilat PE, would recommend waiting as long as possible to stop the heparin for a procedure.  Could use dubhoff for tube feeds once OG is no longer an option.   Please contact GI once heparin can be safely held for 24 hours.   Electronic Signatures: Dow Adolphein, Matthew (MD)  (Signed 17-Dec-15 16:48)  Authored: Details   Last Updated: 17-Dec-15 16:48 by Dow Adolphein, Matthew (MD)

## 2014-05-23 NOTE — Discharge Summary (Signed)
PATIENT NAME:  RAHM, MINIX MR#:  [id number] DATE OF BIRTH:  13-Jul-1950  DATE OF ADMISSION:  01/01/2014 DATE OF DISCHARGE:    INTERIM SUMMARY COVERING 01/01/2014 to 01/06/2014  ADMITTING DIAGNOSIS: Acute on chronic respiratory failure, cellulitis.  INTERIM DISCHARGE DIAGNOSES: 1. Acute on chronic hypoxic and hypercarbic respiratory failure due to a combination of chronic obstructive pulmonary disease exacerbation, as well as acute systolic congestive heart failure exacerbation, as well as  obesity, hypoventilation syndrome, as well as obstructive sleep apnea.  2. Acute on chronic obstructive pulmonary disease exacerbation.  3. Right lower extremity cellulitis.  4. Sepsis present on admission, likely due to cellulitis.  5. Hypertension.  6. Acute on chronic systolic congestive heart failure.  7. Hyponatremia.  8. Diabetes type 2.  9. Tobacco abuse.  10. Morbid obesity.  11. Large chronic sebaceous cyst on the upper back. 12. Coffee ground emesis possibly due to gastritis. No further episodes.  Hemoglobin stable.  CONSULTANTS DURING HOSPITALIZATION:  Dr. Mortimer Fries, Dr. Billey Chang of palliative care, Dr. Sherri Rad, and Dr. Arther Dames.   PERTINENT LABORATORIES AND EVALUATIONS: Admitting glucose 109, BUN 18, creatinine 0.70, sodium 133, potassium 4.8, chloride 94, CO2 of 34, phosphorus 2.2. Magnesium was 1.3. LFTs showed a bilirubin total 1.1, AST 57. Troponin 0.03. WBC 13.8, hemoglobin 18.3, platelet count was 127. Blood cultures: No growth. Urine cultures: No growth. Urinalysis: Negative. ABG: PH of 7.34, pCO2 of 71, bicarbonate 38.3. Chest x-ray showed vascular congestion, borderline cardiomegaly with increased interstitial markings concerning for interstitial edema.   HOSPITAL COURSE:  1. Please refer to H and P done by the admitting physician. The patient is a 64 year old, morbidly obese male with  sleep apnea, COPD, presented with shortness of breath. Initially, the patient was  placed on BiPAP. In terms of his respiratory status, it continued to deteriorate.  He started becoming confused, lethargic. He required intubation on December 5. The patient has been on ventilator since. Dr. Mortimer Fries has been following the patient throughout the hospitalization, adjusting the vent.  2.  Sepsis present on admission due to cellulitis. The patient is on antibiotics with improvement in the lower extremity cellulitis. Blood cultures are negative.  3.  Acute on COPD exacerbation. The patient was treated with nebulizers, steroids. She is currently on the vent.  4.  Coffee ground emesis. The patient had an episode of coffee ground emesis but no further evidence of GI bleed seen by GI,  reported to be treated conservatively.   5.  Likely severe obstructive sleep apnea, currently on vent. At this time, the patient remains critical on the ventilator .  His prognosis is poor. Will likely require trach in the near future.   MEDICATIONS AT THE TIME OF INTERIM SUMMARY: Fentanyl drip, norepinephrine drip, propofol drip, aspirin 81 mg 1 tab p.o. daily, nicotine patch,  nystatin topical powder, (Dictation Anomaly)<<MISSING TEXT>> b.i.d., Zofran 4 mg IV q. 6, chlorhexidine kit, famotidine 20 IV q. 12, Zosyn 4.5 IV q. 8. Lovenox 40 mg subcutaneous 12 hours, Lasix 20 IV q. 12,  Colace 100 q. 12, lorazepam 2 mg IV q.2  p.r.n., Tylenol 500 q. 4 p.r.n., Solu-Medrol 40 IV q. 8h., insulin sliding scale, senna 10 mL q. 8.   TIME SPENT: 35 minutes.     ____________________________ Lafonda Mosses Posey Pronto, MD shp:mw D: 01/07/2014 16:02:00 ET T: 01/07/2014 16:23:36 ET JOB#: [id number]  cc: Jayse Hodkinson H. Posey Pronto, MD, <Dictator>

## 2014-05-23 NOTE — Consult Note (Signed)
General Aspect PCP: Dr. Brynda Greathouse Primary Cardiologist: Dr. Fletcher Anon __________________________  64 year old male with h/o CAD, chronic systolic CHF, chronic respiratory failure on home O2, COPD, HTN, DM2, OSA, extensive smoking history who is admitted to [facility] for acute on chronic respiratory failure and acute on chonic systolic CHF.  _________________________  PMH: 1. CAD s/p cath 05/2013: EF 35%, no evidence of obstructive disease with 60% mLCx & 60% mRCA 2. Chronic systolic CHF: echo 3/50/09 EF 20-25%, mod dilated LA, mildly dialted RA, mild-mod MR, mild aortic sclerosis without stenosis, mildly increased LV posterior wall thickness 3. Chronic respiratpry failure on home O2 4L 4. COPD 5. HTN 6. DM2 7. OSA 8. Tobacco abuse __________________________   Present Illness 64 year old male with h/o CAD, chronic systolic CHF, chronic respiratory failure on home O2, COPD, HTN, DM2, OSA, extensive smoking history who is admitted to [facility] for acute on chronic respiratory failure and acute on chonic systolic CHF.  Patient with extensive medical problems including the above complex problem list.  He was last seen in our office on 05/15/2013 for follow up of his chronic systolic CHF and was doing well at that time, denying any symptoms of worsening dyspnea, orthopnea, or PND. His weight at that time was 311 pounds. He was continuing to smoke at that time, in the setting of home O2. His EF was initially 25%, but improved to 35% per office note. It was recommended that he continue Toprolol, lisinopril, & Eplerenone. There was some consideration for possible ICD in the future.   Last cardiac cath 05/2012 revealing EF 35%, non obstructive disease with 60% stenosis mLCx & 60% stenosis mRCA.   He comes into [facility] now with complaint of increased SOB, DOE, and productive cough of thick white to green sputum for the past 3-4 days. States he was in his usual state of health until "he caught this cold." His weight at home  has been consistently around 316 he reports with his last weigh in 10/20/13. He reports mild bilateral LEE. He sleeps with one pillow at night, but does have the bed elevated some for comfort only. He is able to sleep/breathe without issues supine. He denies ever haveing any chest pain, nausea, vomiting, diaphoresis, presyncope, or syncope. He denies any fevers or chills. He reports that he has quite smoking today.   Upon arrival to [facility] he was found to be tachycardiac and SOB and had to be placed on BiPAP for a short while. Labs are significant for ProBNP 1808, TnI <0.02, SCr 0.83, K+ 4.2, CXR: Pulmonary vascular congestion unchanged compared to prior exam. Cardiomegaly.   Thus far he has received SoluMedrol 125 mg IV push, DuoNeb x 3, Azithromycin 500 mg PO, Levaquin 500 mg IV, Lasix 20 mg IV push.   Physical Exam:  GEN well developed, well nourished, moderate heavy coughing to clear thick sputum during exam   HEENT PERRL, hearing intact to voice, moist oral mucosa   NECK supple   RESP postive use of accessory muscles  deminished breath sounds bilaterally with bilateral wheezing   CARD Regular rate and rhythm  distant heart sounds, difficult exam 2/2 wheezing   ABD denies tenderness  soft  normal BS   LYMPH negative neck   EXTR trace bilateral non pitting edema right > left, chronic venous stasis   SKIN normal to palpation   NEURO motor/sensory function intact   PSYCH alert, A+O to time, place, person, good insight   Review of Systems:  Subjective/Chief  Complaint Cough, SOB, wheezing   General: Fatigue  Weakness   Skin: No Complaints   ENT: No Complaints   Eyes: No Complaints   Neck: No Complaints   Respiratory: Frequent cough  Short of breath  Wheezing  Sputum  white-green sputum   Cardiovascular: Dyspnea  Edema   Gastrointestinal: No Complaints   Genitourinary: No Complaints   Vascular: No Complaints   Musculoskeletal: No Complaints   Neurologic: No  Complaints   Hematologic: No Complaints   Endocrine: No Complaints   Psychiatric: No Complaints   Review of Systems: All other systems were reviewed and found to be negative   Medications/Allergies Reviewed Medications/Allergies reviewed   Family & Social History:  Family and Social History:  Family History Coronary Artery Disease  father: CAD, HLD, HTN.   Social History negative ETOH, negative Illicit drugs   + Tobacco Prior (greater than 1 year)  less than 1 ppd   Place of Living Home  lives in [place].     Multi-drug Resistant Organism (MDRO): Positive culture for MRSA., 31-Jul-2013   Sleep Apnea/ CPAP with O2 @ 2L/M:    O2 @2L /M as needed:    COPD:    Hypertension:    Diabetes Mellitus, Type II (NIDD):    CHF:    Emphysema:    Obesity:    Cataract Surgery Right Eye:    Bilateral knee surgery:          Admit Diagnosis:   ACUTE RESPIRATORY FAILURE: Onset Date: 21-Oct-2013, Status: Active, Description: ACUTE RESPIRATORY FAILURE  Home Medications: Medication Instructions Status  lisinopril 40 mg oral tablet 1 tab(s) orally once a day Active  eplerenone 25 mg oral tablet 1 tab(s) orally once a day Active  metoprolol succinate 50 mg oral tablet, extended release 1 tab(s) orally once a day Active  glimepiride 4 mg oral tablet 1 tab(s) orally once a day (in the morning) Active  nicotine 21 mg/24 hr transdermal film, extended release 1 patch transdermal once a day Active  fluticasone-salmeterol 250 mcg-50 mcg inhalation powder 1 puff(s) inhaled every 12 hours Active  acetaminophen-HYDROcodone 325 mg-5 mg oral tablet 1 tab(s) orally every 6 hours, As Needed - for Pain Active  metFORMIN 500 mg oral tablet 2 tab(s) orally 2 times a day Active  pramipexole 0.5 mg oral tablet 1 tab(s) orally 3 times a day Active  ProAir HFA CFC free 90 mcg/inh inhalation aerosol 2 puff(s) inhaled every 4 to 6 hours, As Needed - for Shortness of Breath Active  Spiriva 18 mcg  inhalation capsule 1 cap(s) inhaled once a day Active   Lab Results:  Hepatic:  22-Sep-15 11:11   Bilirubin, Total 0.7  Alkaline Phosphatase 77 (46-116 NOTE: New Reference Range 08/19/13)  SGPT (ALT) 22 (14-63 NOTE: New Reference Range 08/19/13)  SGOT (AST) 26  Total Protein, Serum 7.5  Albumin, Serum 3.4  Routine Chem:  22-Sep-15 11:11   Glucose, Serum  104  BUN 14  Creatinine (comp) 0.83  Sodium, Serum  135  Potassium, Serum 4.2  Chloride, Serum  95  CO2, Serum  39  Calcium (Total), Serum 9.2  Osmolality (calc) 271  eGFR (African American) >60  eGFR (Non-African American) >60 (eGFR values <80m/min/1.73 m2 may be an indication of chronic kidney disease (CKD). Calculated eGFR is useful in patients with stable renal function. The eGFR calculation will not be reliable in acutely ill patients when serum creatinine is changing rapidly. It is not useful in  patients on dialysis. The eGFR calculation  may not be applicable to patients at the low and high extremes of body sizes, pregnant women, and vegetarians.)  Anion Gap  1  B-Type Natriuretic Peptide ([facility])  1808 (Result(s) reported on 21 Oct 2013 at 11:39AM.)  Cardiac:  22-Sep-15 11:11   Troponin I < 0.02 (0.00-0.05 0.05 ng/mL or less: NEGATIVE  Repeat testing in 3-6 hrs  if clinically indicated. >0.05 ng/mL: POTENTIAL  MYOCARDIAL INJURY. Repeat  testing in 3-6 hrs if  clinically indicated. NOTE: An increase or decrease  of 30% or more on serial  testing suggests a  clinically important change)  Routine Coag:  22-Sep-15 11:11   Prothrombin 14.6  INR 1.2 (INR reference interval applies to patients on anticoagulant therapy. A single INR therapeutic range for coumarins is not optimal for all indications; however, the suggested range for most indications is 2.0 - 3.0. Exceptions to the INR Reference Range may include: Prosthetic heart valves, acute myocardial infarction, prevention of myocardial infarction, and  combinations of aspirin and anticoagulant. The need for a higher or lower target INR must be assessed individually. Reference: The Pharmacology and Management of the Vitamin K  antagonists: the seventh ACCP Conference on Antithrombotic and Thrombolytic Therapy. BDZHG.9924 Sept:126 (3suppl): [id number]. A HCT value >55% may artifactually increase the PT.  In one study,  the increase was an average of 25%. Reference:  "Effect on Routine and Special Coagulation Testing Values of Citrate Anticoagulant Adjustment in Patients with High HCT Values." American Journal of Clinical Pathology 2006;126:400-405.)  Activated PTT (APTT) 31.8 (A HCT value >55% may artifactually increase the APTT. In one study, the increase was an average of 19%. Reference: "Effect on Routine and Special Coagulation Testing Values of Citrate Anticoagulant Adjustment in Patients with High HCT Values." American Journal of Clinical Pathology 2006;126:400-405.)  Routine Hem:  22-Sep-15 11:11   WBC (CBC) 7.0  RBC (CBC) 4.81  Hemoglobin (CBC) 15.3  Hematocrit (CBC) 48.2  Platelet Count (CBC) 161 (Result(s) reported on 21 Oct 2013 at 11:28AM.)  MCV 100  MCH 31.8  MCHC  31.8  RDW  17.8   EKG:  EKG Interp. by me   Interpretation EKG shows sinus tachycardia, 101, occasional PVC, 1st degree AV block   Radiology Results: XRay:    22-Sep-15 11:25, Chest Portable Single View  Chest Portable Single View   REASON FOR EXAM:    Chest Pain  COMMENTS:       PROCEDURE: DXR - DXR PORTABLE CHEST SINGLE VIEW  - Oct 21 2013 11:25AM     CLINICAL DATA:  Chest pain, low O2 saturation.    EXAM:  PORTABLE CHEST - 1 VIEW    COMPARISON:  August 04, 2013    FINDINGS:  Theheart size and mediastinal contours stable. The heart size is  enlarged. There is pulmonary vascular congestion. There is no focal  pneumonia or pleural effusion. The visualized skeletal structures  are stable.     IMPRESSION:  Pulmonary vascular congestion  unchanged compared to prior exam.  Cardiomegaly.      Electronically Signed    By: Abelardo Diesel M.D.    On: 10/21/2013 11:35         Verified By: Abelardo Diesel, M.D.,    AndroGel 2.5 g/packet: SOB, Swelling  Vital Signs/Nurse's Notes: **Vital Signs.:   22-Sep-15 16:12  Vital Signs Type Admission  Temperature Temperature (F) 98.3  Celsius 36.8  Temperature Source oral  Pulse Pulse 88  Respirations Respirations 21  Systolic BP Systolic BP 268  Diastolic BP (mmHg) Diastolic BP (mmHg) 79  Mean BP 104  Pulse Ox % Pulse Ox % 87  Pulse Ox Activity Level  At rest  Oxygen Delivery 2L    Impression 64 year old male with h/o CAD, chronic systolic CHF, chronic respiratory failure on home O2, COPD, HTN, DM2, OSA, extensive smoking history who is admitted to [facility] for acute on chronic respiratory failure and acute on chonic systolic CHF.Cardiology was consulted for acute on chronic systolic CHF.   1. Acute on chronic respiratory failure likely 2/2 COPD exacerbation: -No longer requiring BiPAP -On 4L [place] -On antibiotics and nebs per IM Will likely need prednisone taper  2. Acute on chronic CHF: -Check echo (last EF 35% on cath 05/2012) -Already received IV Lasix 20 mg push -Continue IV Lasix 40 mg BID (home dose 40 mg daily); SCr 0.83 currently  -Continue lisinopril 40 mg daily, Toprol XL 50 mg daily, & Eplerenone 25 mg daily -Possibly 2/2 COPD exacerbation, agree with antibiotic tx -TnI negative x 1   3. CAD: -Cath 05/2012 with non obstructive disease, 60% stenosis mLCx & mRCA -No angina  -On aspirin 81 mg at home -No ischemic work up planned at this time unless dictated by echo and further TnI  4. DM2 -SSI per IM  5. Tobacco abuse -Per patient he has quit  6. OSA -CPAP   Electronic Signatures: Rise Mu (PA-C)  (Signed 22-Sep-15 17:34)  Authored: General Aspect/Present Illness, History and Physical Exam, Review of System, Family & Social History, Past Medical  History, Home Medications, Labs, EKG , Radiology, Allergies, Vital Signs/Nurse's Notes, Impression/Plan Ida Rogue (MD)  (Signed 22-Sep-15 19:00)  Authored: General Aspect/Present Illness, History and Physical Exam, Review of System, Family & Social History, Health Issues, EKG , Vital Signs/Nurse's Notes, Impression/Plan  Co-Signer: General Aspect/Present Illness, Home Medications, Allergies   Last Updated: 22-Sep-15 19:00 by Ida Rogue (MD)

## 2014-05-23 NOTE — Consult Note (Signed)
PATIENT NAME:  Robert Mccarthy, Robert Mccarthy MR#:  [id number] DATE OF BIRTH:  Nov 15, 1950  DATE OF CONSULTATION:  07/30/2013  REFERRING PHYSICIAN:   CONSULTING PHYSICIAN:  Linus Galasodd Kaytlen Lightsey, DPM  REASON FOR CONSULTATION: This is a 64 year old male with a history of diabetes who has had a history of a callus and a sore beneath his right great toe joint. He has been seen outpatient by Dr. Orland Jarredroxler, but this has been at least 5-6 months ago or maybe up to a year. He states he had been trimming on the area himself. Last week, he thinks he may have gotten a little too deep. Started to have some redness and swelling. Eventually developed some weakness and had a fall a couple of days ago. Presented to the Emergency Department where he was found to be febrile at 101.8 degrees Fahrenheit with some respiratory distress and tachycardia. The patient is admitted for IV antibiotics and definitive treatment. The patient denies any injury to the foot other than the callus.   REVIEW OF SYSTEMS:  CONSTITUTIONAL: Positive for fever and generalized weakness.  EYES: Denies blurring or double vision.  EARS, NOSE AND THROAT: No tinnitus or ear pain or hearing loss.  RESPIRATORY: Denies cough, shortness of breath, cough or wheezing, but does have shortness of breath.  CARDIOVASCULAR: Denies chest pain, orthopnea, or arrhythmias.  GASTROINTESTINAL: No nausea or vomiting. No abdominal pain.  GENITOURINARY: Denies dysuria or incontinence.  ENDOCRINE: Denies heat or cold intolerance. No excessive sweating.  SKIN:  Significant edema and redness in his right leg. Has had the open sore on the bottom of his right great toe joint.  NEUROLOGIC: Denies any numbness or paresthesias.   PAST MEDICAL HISTORY:  1.  Diabetes.  2.  Hypertension.  3.  Congestive heart failure.  4.  Chronic obstructive pulmonary disease.  5.  Obstructive sleep apnea.  6.  Morbid obesity.  7.  Chronic respiratory failure on home oxygen 2 liters.   HOME MEDICATIONS:   1.  Spiriva 18 mcg once daily.  2.  ProAir HFA 2 puffs inhalation 4 times a day.  3.  Pramipexole 0.5 mg oral daily.  4.  Metoprolol 50 mg extended-release once daily.  5.  Lisinopril 40 mg 1 tablet daily. 6.  Glimepiride 4 mg 1 p.o. daily.  7.  Eplerenone 25 mg oral once daily.   ALLERGIES: ANDROGEL.   FAMILY HISTORY: Hypertension and heart disease, as well as diabetes in multiple family members.   SOCIAL HISTORY: The patient smokes up to 2 packs every day for more than 40 years. Denies alcohol use.   PAST SURGICAL HISTORY: Hernia repair and bilateral knee surgery.   PHYSICAL EXAMINATION: GENERAL:  The patient is morbidly obese. Currently appears to be no specific distress. Alert and oriented.  VASCULAR: DP and PT pulses are palpable, diminished on the right, but this may be due to significant edema.  NEUROLOGICAL: There is loss of protective threshold with a monofilament wire distally in the toes. Proprioception is somewhat impaired as well.  INTEGUMENT: The skin is dry and atrophic with some bilateral hyperpigmentations. Significant edema in the right lower extremity as compared to the left. A full thickness ulceration with significant necrotic tissue is noted beneath the right first metatarsal. Ulcer measures approximately 2 cm diameter with a depth that probes to the deep subcutaneous tissues towards the joint. Cannot clearly probe bone.  MUSCULOSKELETAL: Stiff range of motion of the pedal joints. Muscle testing is deferred.   X-RAYS: Multiple views of the right  foot reveal a normal joint space pattern. There is no clear evidence of any cortical erosion suggestive of osteomyelitis. Does appear to be some superficial air pockets located beneath the first metatarsal, mostly noted on the lateral view.   LABORATORY DATA: Elevated white count at 15.7 with a significant left shift in neutrophils.   IMPRESSION:  1.  Diabetes with some associated neuropathy.  2.  Full thickness  ulceration, right foot with cellulitis of the foot and leg.   PLAN: Excisional debridement of devitalized tissue from the wound down to the subcutaneous tissue. Debridement was limited secondary to pain. A dressing was then applied. I discussed with the patient the need for debridement. We will try to obtain an MRI for further evaluation of possible bone or joint involvement, and to assist in surgical planning. Consent form for debridement in the right foot. We will try to schedule this for tomorrow afternoon.    ____________________________ Linus Galas, DPM tc:ts D: 07/30/2013 17:41:00 ET T: 07/30/2013 19:37:21 ET JOB#: [id number]  cc: Linus Galas, DPM, <Dictator> Jayd Cadieux DPM ELECTRONICALLY SIGNED 08/02/2013 23:17

## 2014-05-23 NOTE — H&P (Signed)
PATIENT NAME:  Robert Mccarthy, Robert Mccarthy MR#:  [id number] DATE OF BIRTH:  [birth year]  DATE OF ADMISSION:  07/29/2013  ADDENDUM:  This is an addendum to assessment and plan:  Patient is a smoker. Smoking cessation counseling is done for 4 minutes and offered nicotine patch. He agrees to try nicotine patch, but currently does not want to quit. Counseling done 4 minutes.      ____________________________ Hope PigeonVaibhavkumar G. Elisabeth PigeonVachhani, MD vgv:cs D: 07/29/2013 17:13:57 ET T: 07/29/2013 18:02:21 ET JOB#: [id number]  cc: Hope PigeonVaibhavkumar G. Elisabeth PigeonVachhani, MD, <Dictator> Altamese DillingVAIBHAVKUMAR VACHHANI MD ELECTRONICALLY SIGNED 07/29/2013 21:17

## 2014-05-23 NOTE — H&P (Signed)
PATIENT NAME:  Robert Mccarthy, Robert Mccarthy MR#:  [id number] DATE OF BIRTH:  1950-12-16  DATE OF ADMISSION:  01/01/2014  REFERRING PHYSICIAN: Marjean Donna, MD  PRIMARY CARE PHYSICIAN: Nicky Pugh, MD  ADMIT DIAGNOSIS: Acute on chronic respiratory failure and cellulitis.   HISTORY OF PRESENT ILLNESS: This is a 64 year old Caucasian male who presents to the Emergency Department via EMS after his wife found him at home slumping over the rail of a bar. The patient had not been drinking, but he was confused and gasping for air. His wife has seen him this way before when he is hypoxic. She notes that he was visibly shaking. The patient admits to shortness of breath, but denies any chest pain, nausea or diaphoresis. In the Emergency Department, the patient continued to have high oxygen requirements and was found to meet sepsis criteria which prompted them to call for admission.   REVIEW OF SYSTEMS: CONSTITUTIONAL: The patient denies fever or weakness.  EYES: Denies blurred vision or inflammation.  EARS, NOSE AND THROAT: Denies tinnitus or difficulty swallowing.  RESPIRATORY: The patient admits to shortness of breath and cough.  CARDIOVASCULAR: Denies chest pain or palpitations.  GASTROINTESTINAL: Denies nausea, vomiting, diarrhea, or abdominal pain.  GENITOURINARY: Denies dysuria, increased frequency, or hesitancy.  ENDOCRINE: Denies polyuria, polydipsia.  HEMATOLOGIC AND LYMPHATIC: Denies easy bruising or bleeding.  INTEGUMENT: Denies rashes but admits to a lesion on the inside of his thigh.  MUSCULOSKELETAL: Denies myalgias or arthralgias.  NEUROLOGIC: Denies numbness in his extremities or difficulty speaking.  PSYCHIATRIC: Denies depression or suicidal ideation.   PAST MEDICAL HISTORY: Hypertension, COPD, congestive heart failure, diabetes type 2, tobacco abuse, obstructive sleep apnea and obesity.   PAST SURGICAL HISTORY: The patient had arthroscopic knee clean out, right foot debridement  secondary to osteomyelitis and an inguinal hernia repair.   SOCIAL HISTORY: The patient smokes 1 pack per day and has so for the last 45 years. He denies drinking or any illicit drug use. He lives with his wife, who is his primary caretaker.   FAMILY HISTORY: Significant for heart disease and diabetes throughout the family.   MEDICATIONS: 1.  Arformoterol 15 mcg/2 mL inhaled solution 2 mL inhaled b.i.d.  2.  Aspirin 81 mg 1 tab p.o. daily.  3.  Eplerenone 25 mg 1 tablet p.o. daily.  4.  Furosemide 40 mg 1 tablet p.o. daily.  5.  Glimepiride 4 mg one tab p.o. q. a.m.   6.  Lisinopril 40 mg 1 tab p.o. q. day.  7.  Metoprolol succinate 50 mg extended release tablet 1 tablet p.o. daily.  8.  Pramipexole 1 mg one tab p.o. daily.  9.  ProAir 90 mcg actuator inhaler 2 puffs as needed every 4 to 6 hours for shortness of breath.  10.  Spiriva 18 mcg 1 capsule inhaled once daily.   ALLERGIES: ANDROGEL.  PERTINENT LABORATORY RESULTS AND RADIOGRAPHIC FINDINGS: Serum glucose 109, BUN 18, creatinine 0.70, sodium 133, potassium 4.8, bicarb 34, calcium 9.4. Phosphorus 2.2. Mag 1.3. Serum albumin 3.6, alk phos 96, AST 57, ALT 41. White blood cell count 13.8, hemoglobin 18.3, hematocrit 56.5, platelet count 127,000. Troponin is 0.03. Urinalysis shows 100 mg/dL of protein, but it is nitrite and leukocyte esterase negative.   Arterial blood gas shows a pH of 7.34, pCO2 of 71, pO2 of 101 with a base excess of 10 on 98% FiO2; lactic acid is 0.8.   Chest x-ray shows congestion and borderline cardiomegaly with increased interstitial markings raising concern for mild  interstitial edema.   PHYSICAL EXAMINATION: VITAL SIGNS: Temperature 103, pulse 115, respirations 24, blood pressure 168/96, pulse ox 94% on 3 liters of oxygen via nasal cannula.  GENERAL: The patient is alert and oriented, in mild distress.  HEENT: Normocephalic, atraumatic. Pupils equal, round, and reactive to light and accommodation. Extraocular  movements are intact. Mucous membranes are moist.  NECK: Trachea is midline. No adenopathy.  CHEST: Symmetric, atraumatic.  CARDIOVASCULAR: Tachycardic. Normal S1, S2. No rubs, clicks, or murmurs appreciated.  LUNGS: Crackles at the bases bilaterally with good to moderate air movement with prolonged expiratory phase. The patient does have some subglottic retraction as well when he breathes.  ABDOMEN: Positive bowel sounds. Soft, nontender, nondistended. No hepatosplenomegaly.  GENITOURINARY: Foley is in place.  MUSCULOSKELETAL: The patient moves all 4 extremities equally. There is 5/5 strength in upper and lower extremities bilaterally.  SKIN: No rashes, although the patient does have a baseball-sized area of warmth, induration, and erythema on the inner aspect of his right thigh. It is nonfluctuant and no puncta indicating possible inoculation from insect bite or trauma. The patient also has reddened skin up to his ankles from what appears to be chronic venous stasis dermatitis.  EXTREMITIES: No clubbing or cyanosis, but the patient does have edema of his lower extremities, as described above.  NEUROLOGIC: Cranial nerves II through XII are grossly intact.  PSYCHIATRIC: Mood is normal. Affect is congruent.   ASSESSMENT AND PLAN: This is a 64 year old male admitted for acute on chronic respiratory failure and cellulitis.  1.  Acute on chronic respiratory failure. The patient is hypercarbic and hypoxic. We will give the patient supplemental oxygen as needed and wean for oxygen saturations 88 to 92%.  2.  Chronic obstructive pulmonary disease. Will substitute Advair for Arformoterol and give the patient albuterol with ipratropium via nebulizer treatment as he may not have the inspiratory effort required to make best use of his inhalers.  3.  Cellulitis. There is a small area on the right inner thigh. We have obtained blood cultures and the patient has gotten 1 dose of vancomycin and Zosyn at the time of  this dictation.  4.  Sepsis. The patient meets criteria via fever, leukocytosis, tachycardia and tachypnea. He is currently hemodynamically stable.  5.  Hypertension. The patient was hypertensive when he arrived, but this is improving. We will continue lisinopril.  6.  Congestive heart failure. The patient has some systolic failure that is nonischemic based on his last cardiac catheterization this year. Mild interstitial edema on his chest x-ray as well as his clinically wet lungs obligate at least one dose of diuretic. I have ordered for him to get Lasix 40 mg IV x1.  7.  Hyponatremia. This appears to be hypervolemic. Will hold eplerenone at this time.   8.  Diabetes type 2. We will hold the patient's oral hypoglycemics and place him on sliding-scale insulin while hospitalized.  9.  Tobacco abuse. Will do NicoDerm patches while the patient is in the hospital.  10.  Obstructive sleep apnea. The patient may need some nocturnal BiPAP while he is hospitalized.  11.  Obesity. The patient's BMI is 41.1. I have ordered a heart healthy diet and encouraged portion control.  12.  Deep vein thrombosis prophylaxis. Heparin.  13.  Gastrointestinal prophylaxis. Unnecessary as the patient is not critically ill.   CODE STATUS: The patient is a FULL code.   TIME SPENT ON ADMISSION ORDERS AND PATIENT CARE: Approximately 45 minutes. ____________________________ Norva Riffle. Marcille Blanco,  MD msd:sb D: 01/01/2014 08:00:13 ET T: 01/01/2014 08:17:13 ET JOB#: [id number]  cc: Norva Riffle. Marcille Blanco, MD, <Dictator> Norva Riffle Jesica Goheen MD ELECTRONICALLY SIGNED 01/02/2014 0:58

## 2014-05-23 NOTE — Consult Note (Signed)
Paged at 9:20 today from ICU with patient having issues with tachypnea and tachycardia and blood pressure.  Given his already critically ill state and worsening of stability in his cardiac and respiratory status this morning, decision to hold off on elective procedure temporarily and reschedule to a time where the patient is more stable.  Discussed with family who agreed they did not want to put him further at risk with doing an elective procedure while vital signs are becoming more unstable in a critically ill patient.  Will discuss  with partners regarding the timing of the rescheduling of the tracheostomy placement.  Electronic Signatures: Ander Wamser, Rayfield Citizenreighton Charles (MD)  (Signed on 24-Dec-15 11:14)  Authored  Last Updated: 24-Dec-15 11:14 by Flossie DibbleVaught, Chimaobi Casebolt Charles (MD)

## 2014-05-23 NOTE — Consult Note (Signed)
PATIENT NAME:  Robert Mccarthy, Robert Mccarthy MR#:  [id number] DATE OF BIRTH:  1950/05/21  DATE OF CONSULTATION:  01/16/2014  CONSULTING PHYSICIAN:  Stann Mainlandavid P. Sampson GoonFitzgerald, MD  REQUESTING PHYSICIAN: Herschell Dimesichard J. Wieting, MD   REASON FOR CONSULTATION: Fever and bacteremia.   HISTORY OF PRESENT ILLNESS: This is a 64 year old gentleman with multiple medical problems who I follow as an outpatient for prior chronic osteomyelitis of his foot. He was admitted 12/03 with acute on chronic respiratory failure and worsening redness and cellulitis in his leg. On admission, he was treated initially with antibiotics and treated for a COPD exacerbation; however, he had progressive decline and was intubated on 12/06 for respiratory failure. Since then, he has been in the intensive care unit with ventilator-dependent respiratory failure for the last 12 days. He has had a course of antibiotics that were stopped; however, he has developed recurrent high fevers as well as found to have positive blood cultures. Of note, the patient was largely afebrile after 12/08 and became febrile again 12/15 when he started to have daily fever sweats. Per nursing, he also has developed a rash on his back that is quite disseminated as well as periodontitis with loosening of his teeth. He has a large, necrotic, sacral decubitus ulcer.   PAST MEDICAL HISTORY: 1. Hypertension.  2. Diabetes.  3. CHF.  4. COPD.  5. Obstructive sleep apnea.  6. Morbid obesity.  7. Chronic respiratory failure on home O2.  8. 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   PAST SURGICAL HISTORY:  1. Most recently incision and drainage of the right foot and right great toe joint in 07/2013 for osteomyelitis.  2. Hernia repair.  3. Bilateral knee surgery.   FAMILY HISTORY: Noncontributory.   SOCIAL HISTORY: The patient smokes heavily. Does not drink apparently. Other details are unavailable.   ALLERGIES: THE PATIENT IS LISTED AS BEING  ALLERGIC TO ANDROGEL.   REVIEW OF SYSTEMS: Unable to be obtained.   PRIOR ANTIBIOTICS: Fluconazole 12/10 through 12/13, levofloxacin 12/13 through 12/17, Zosyn 12/04 through 12/10, vancomycin 12/02 through 12/06 and restarted again 12/17.   CURRENT ANTIBIOTICS: Are levofloxacin and vancomycin.   PHYSICAL EXAMINATION: VITAL SIGNS: Temperature 98.8, pulse 78, blood pressure 112/53, respirations 30, saturation 95% on the ventilator.   GENERAL: He is critically ill-appearing, morbidly obese. He has an ET tube and an OG-tube in place. He has a Foley catheter in, as well as a rectal tube.   HEENT: Pupils are reactive.   NECK: Supple.   HEART: Has distant heart sounds, but regular.   LUNGS: Coarse breath sounds bilaterally.   ABDOMEN: Massively obese. No obvious tenderness.   EXTREMITIES: He has 1+ edema, bilateral lower extremities. His right foot has a chronic ulcer, which has some dry packing in it, but it does not appear actively infected. Per nursing, he has a sacral decubitus that is unstageable.   SKIN: He has a diffuse erythematous eruption on his back. He also has a skin tear on his ear. His oropharynx is poor dentition.   DATA: White blood count 14.9 on 12/15, hemoglobin 15.9, platelets 246,000. Renal function shows a BUN of 69, creatinine 0.92. Blood cultures 2/2 are growing gram-positive cocci from 12/15 in clusters. Sputum culture 12/17 is being held for possible pathogens. Clostridium difficile 12/16 is negative. Urinalysis 12/16 showed 10 white cells. Urine culture had no growth. Sputum culture 12/07 grew moderate growth Candida albicans as well as rare Stenotrophomonas maltophilia sensitive to levofloxacin and Bactrim.  Blood cultures 12/03 were negative. Culture of his foot from July grew methicillin-sensitive Staphylococcus aureus as well as heavy growth of an organism unable to be identified.   IMAGING: Lower extremity Dopplers 12/17 showed nonocclusive DVT in the right deep  femoral vein. CT of the chest 12/17 showed bilateral acute PE with mild right heart strain. There is bibasilar airspace opacity, combination of infiltrates and atelectasis.   IMPRESSION: A 64 year old, critically-ill gentleman in the intensive care unit since 12/06 after being admitted 12/03 with respiratory failure. He has had an acute PE as well as ventilator-dependent respiratory failure. He has had recurrent fevers and now has gram-positive cocci in clusters in his blood cultures. He has a large decubitus ulcer on his back. He also has a diffuse rash, which appears related to areas of moisture on his back. He has an elevated white count. He does have a line in place, as well as a Foley catheter and rectal tube, although Clostridium difficile and urine sample have been negative. He had Stenotrophomonas and Candida in sputum.   RECOMMENDATIONS: 1. Continue vancomycin and levofloxacin.  2. I suspect he has a source from his sacral decubitus ulcer. This will need to be addressed by wound care and possibly surgery.  3. If his cultures are positive for methicillin-resistant Staphylococcus aureus, he will need to have removal of his PICC line.  4. Given his chronic debilitated state, morbid obesity, chronic obstructive pulmonary disease with acute on chronic respiratory failure, now ventilator-dependent respiratory failure, I think palliative care would be an important option for this patient.   Thank you for this consult. I will be glad to follow with you.     ____________________________ Stann Mainland. Sampson Goon, MD dpf:TT D: 01/16/2014 21:00:56 ET T: 01/16/2014 21:57:54 ET JOB#: [id number]  cc: Stann Mainland. Sampson Goon, MD, <Dictator>  Tmya Wigington Sampson Goon MD ELECTRONICALLY SIGNED 01/17/2014 13:04

## 2014-05-23 NOTE — Consult Note (Signed)
20 Fr G tube successfully placed in antrum. Insertion site at 2.5 cm. Ok to start using G tube for flushes and meds today. If stable, can start TF tomorrow AM. thanks.  Electronic Signatures: Lutricia Feilh, Tivis Wherry (MD)  (Signed on 29-Dec-15 13:21)  Authored  Last Updated: 29-Dec-15 13:21 by Lutricia Feilh, Deshonna Trnka (MD)

## 2014-05-23 NOTE — Consult Note (Signed)
PATIENT NAME:  Robert Mccarthy, Robert Mccarthy MR#:  [id number] DATE OF BIRTH:  1951/01/03  DATE OF CONSULTATION:  07/30/2013.  REQUESTING PHYSICIAN:  Dr. Manuella Ghazi.   CONSULTING PHYSICIAN:  Cheral Marker. Ola Spurr, MD  REASON FOR CONSULT:  Cellulitis.   HISTORY OF PRESENT ILLNESS: This is a 64 year old gentleman with history of morbid obesity as well as COPD on home O2, diabetes, well controlled.  He was admitted June 30 with fever and increasing spreading redness on the right foot and leg. He had been dealing, he says, with a sore on the bottom of his foot for several months since he had debridement of a callus by podiatry. This has progressed and has been draining some necrotic fluid. He is a poor historian but says he was not having fevers at home prior to this admission. He was very weak and was brought in by his family. He was also found to be in respiratory distress.   PAST MEDICAL HISTORY:  1.  Hypertension.  2.  COPD, on chronic home O2.  3.  CHF.  4.  Morbid obesity.  5.  Obstructive sleep apnea.   SOCIAL HISTORY: Continues to smoke 2 packs of cigarettes a day. No alcohol or drugs.   PAST SURGICAL HISTORY: Hernia repair, bilateral knee surgeries.   FAMILY HISTORY: Noncontributory.   ALLERGIES: No known drug allergies.    ANTIBIOTICS SINCE ADMISSION: Include vancomycin and Zosyn.   OTHER MEDICATIONS:  Solu-Medrol for COPD exacerbation.    PHYSICAL EXAMINATION: VITAL SIGNS:  Temperature 101.8 on admission, currently 98.5, pulse 83, blood pressure 152/72, respirations 22, oxygen saturation 92% on 4 liters.  GENERAL: He is morbidly obese, lying in bed.  HEENT: Pupils equal, round, and reactive to light and accommodation. Extraocular movements are intact. Sclerae are anicteric. Oropharynx is clear.  HEART: Distant heart sounds.  LUNGS: Poor air movement bilaterally.  ABDOMEN: Obese, soft, mildly distended, nontender.  EXTREMITIES: He has 2+ edema in the left lower extremity and 3+ in the right  lower extremity.  His right lower extremity has marked edema, erythema and tenderness up to the knee. On the plantar surface of his foot he has a 1.5-cm necrotic area of ulceration with foul-smelling drainage. There is necrotic tissue apparent.    IMAGING DATA:  X-ray of the foot shows diffuse soft tissue swelling consistent with cellulitis and edema but no significant radiographic evidence of osteomyelitis.   Chest x-ray showed cardiac enlargement with vascular congestion.   LABORATORY DATA:  White blood count on admission was 10.2, currently is 15.7 after steroids. Hemoglobin 15.0, platelets 171. Urine culture, blood culture negative. Renal function shows creatinine 0.84. LFTs are normal except albumin is low at 2.3 and T-bilirubin 1.2.   Urinalysis showed 4 white cells.   IMPRESSION: A 64 year old with chronic ulcer of his foot, now with impressive cellulitis. He was febrile on admission. Cultures are pending and are negative. X-ray did not show evidence of osteomyelitis, although there may be some underlying bony involvement.   RECOMMENDATIONS:  1.  Continue vancomycin and Zosyn.  2.  Follow cultures.  3.  Podiatry consult.  4.  Elevate leg on at least 6 pillows at all times to decrease edema.  5.  Check ESR and CRP.   Thank you for the consult. I will be glad to follow with you.   ____________________________ Cheral Marker. Ola Spurr, MD dpf:lt D: 07/30/2013 14:26:57 ET T: 07/30/2013 15:43:41 ET JOB#: [id number]  cc: Cheral Marker. Ola Spurr, MD, <Dictator> Hershy Flenner Ola Spurr MD ELECTRONICALLY SIGNED 08/18/2013 21:15

## 2014-05-23 NOTE — Consult Note (Signed)
Brief Consult Note: Diagnosis: respiratory failure.   Patient was seen by consultant.   Comments: Similar to PEG placement, patient will need to be off Heparin for ~12 hours with normal PT/PTT prior to tracheostomy tube placement and then I recommend being off heparin for ~12 hours after placement or longer due to high risk of bleeding from an open wound.  I will tentatively reserve time next week on OR day for tracheostomy tube placement, but will need to re-evaluate things next week due to being diagnosed with submassive bilateral PE's.  Electronic Signatures: Jakerria Kingbird, Rayfield Citizenreighton Charles (MD)  (Signed 17-Dec-15 17:58)  Authored: Brief Consult Note   Last Updated: 17-Dec-15 17:58 by Flossie DibbleVaught, Lauranne Beyersdorf Charles (MD)

## 2014-05-23 NOTE — Op Note (Signed)
PATIENT NAME:  Robert Mccarthy, Robert Mccarthy MR#:  [id number] DATE OF BIRTH:  September 24, 1950  DATE OF PROCEDURE:  07/31/2013  PREOPERATIVE DIAGNOSIS: Osteomyelitis, right great toe joint.   POSTOPERATIVE DIAGNOSIS: Osteomyelitis, right great toe joint.   PROCEDURE: Incision and drainage with excisional debridement of soft tissue and bone, right great toe joint.   SURGEON: Ricci Barkerodd W. Jaydynn Wolford, DPM   ANESTHESIA: Local MAC.   HEMOSTASIS: Pneumatic tourniquet, right calf, 300 mmHg.   ESTIMATED BLOOD LOSS: 50 mL.   MATERIALS:  STIMULAN rapid cure beads impregnated with gentamicin and vancomycin.   DRAINS: None.   PATHOLOGY: Bone, right great toe joint.   COMPLICATIONS: None apparent.   OPERATIVE INDICATIONS: This is a 64 year old male with recent development of an ulceration causing hospitalization. The ulceration probe full thickness down to the level of bone and osteomyelitis was confirmed by MRI and decision was made for debridement of the infected soft tissue as well as the right great toe joint.   OPERATIVE PROCEDURE: The patient was taken to the operating room and placed on the table in the supine position. Following satisfactory sedation, the right foot was anesthetized with 10 mL of 0.5% Sensorcaine plain around the right 1st metatarsal. A pneumatic tourniquet was applied at the level of the right calf and the foot and leg were prepped and draped in the usual sterile fashion. The leg was exsanguinated and the tourniquet inflated to 250 mmHg.   Attention was then directed to the dorsal aspect of the right foot where an approximate 5 cm linear incision was made over the 1st metatarsal and metatarsophalangeal joint. The incision was deepened sharply down to the level of the joint.  As the capsule was incised, a large amount of purulent drainage was immediately expressed from the joint. The soft tissues were removed from surrounding the joint and the base of the proximal phalanx and head of the 1st  metatarsal were resected. The sesamoids were also resected with the joint and removed. At this point, the plantar ulceration was debrided sharply using a Rongeur and a VersaJet debrider on a setting of 7. The wound was debrided to full-thickness down to the superficial and deep subcutaneous tissues as well as the raw bony edges. There was noted to be good removal of all devitalized tissue and the wound was then copiously irrigated with pulsed lavage, 3 liters of saline solution. The dorsal incision was then closed using 4-0 nylon vertical mattress and simple interrupted sutures followed by implanting the [facility] antibiotic beads into the plantar wound. Several simple interrupted sutures were then placed through the plantar wound to reapproximate the edges with a small opening left in the central aspect. Xeroform and 4 x 4's and a sterile bandage were then applied followed by an Ace wrap. The tourniquet was released to the right leg. The patient tolerated the procedure and anesthesia well and was transported to the PACU with vital signs stable and in good condition.    ____________________________ Linus Galasodd Divya Munshi, DPM tc:dd D: 07/31/2013 19:14:00 ET T: 08/01/2013 04:25:49 ET JOB#: [id number]  cc: Linus Galasodd Camira Geidel, DPM, <Dictator> Braeleigh Pyper DPM ELECTRONICALLY SIGNED 08/15/2013 11:24

## 2014-05-23 NOTE — H&P (Signed)
PATIENT NAME:  Robert Mccarthy, Robert Mccarthy MR#:  [id number] DATE OF BIRTH:  08/25/1950  DATE OF ADMISSION:  10/21/2013  PRIMARY CARE PROVIDER: Dr. Maryellen PileEason  EMERGENCY DEPARTMENT REFERRING PHYSICIAN: Toney RakesEryka Gayle, MD  CHIEF COMPLAINT: Shortness of breath, cough.   HISTORY OF PRESENT ILLNESS: The patient is a 64 year old white male with history of hypertension, diabetes, systolic CHF, COPD, obstructive sleep apnea, morbid obesity, and chronic respiratory failure on 4 liters of oxygen at all time, uses CPAP at bedtime, presents with shortness of breath ongoing for the past 3 to 4 days as well as cough, productive, over the past 3 to 4 days. He also has had wheezing. He came to the ED very tachycardic and short of breath, had to be placed on a BiPAP. The patient otherwise denies any chest pains, fevers or chills. Denies any nausea, vomiting or diarrhea.   PAST MEDICAL HISTORY: Significant for:  1.  Hypertension.  2.  Diabetes.  3.  Systolic CHF in the past with cardiac catheterization done on May 22 that showed moderate 2 vessel coronary artery disease with no flow limiting lesions noted.  4.  COPD. 5.  Obstructive sleep apnea.  6.  Morbid obesity.  7.  Chronic respiratory failure, on 4 liters of oxygen.  8.  Nicotine addiction.  9.  Hernia repair. 10.  Bilateral knee surgery. 11.  Right foot surgery.  ALLERGIES: ANDROGEL.  CURRENT MEDICATIONS: Spiriva 18 mcg daily, ProAir 2 puffs every 4 to 6 hours as needed, pramipexole 0.5 one tab 3 times a day, nicotine 21 mg patch every 24 hours, metoprolol succinate 50 daily, metformin 500 two tabs b.i.d., lisinopril 40 mg 1 tab p.o. daily, glimepiride 4 mg daily, Advair 1 puff b.i.d., eplerenone 25 mg daily, acetaminophen with hydrocodone 325/5 one 1 tab p.o. q. 6 p.Mccarthy.n. pain.   FAMILY HISTORY: Significant for hypertension, coronary artery disease and diabetes.   SOCIAL HISTORY: The patient has smoked 1-1/2 packs per day for more than 40 years. Denies any  alcohol use or illicit drug use.   REVIEW OF SYSTEMS: CONSTITUTIONAL: Denies any fevers. Has fatigue and weakness. No weight loss or weight gain.  EYES: No blurred or double vision. No redness. No inflammation.  ENT: No tinnitus. No ear pain. No hearing loss. No seasonal or year-round allergies. No epistaxis. No difficulty swallowing.  RESPIRATORY: Does have cough, wheezing. Has chronic dyspnea. Has COPD. Has sleep apnea.  CARDIOVASCULAR: Denies any chest pain. Complains of edema. No arrhythmia. Complains of dyspnea on exertion. No syncope.  GASTROINTESTINAL: No nausea, vomiting, diarrhea. No abdominal pain. No hematemesis. No melena.  GENITOURINARY: Denies any dysuria, hematuria, renal colic or frequency.  ENDOCRINE: Denies any polyuria, nocturia or thyroid problems.  HEMATOLOGIC AND LYMPHATIC: Denies anemia, easy bruisability or bleeding.  SKIN: No acne. No rash.  MUSCULOSKELETAL: Denies any pain in the neck, back or shoulder.  NEUROLOGIC: No CVA, TIA or seizures.  PSYCHIATRIC: Denies anxiety, insomnia, ADD or OCD.   PHYSICAL EXAMINATION: VITAL SIGNS: Temperature 99.6, pulse 99, respirations 30, blood pressure 150/86, O2 80% on 4 liters.  GENERAL: The patient is an obese male, critically ill-appearing. Currently on BiPAP.  HEENT: Head atraumatic, normocephalic. Pupils equally round and reactive to light and accommodation. There is no conjunctival pallor. No scleral icterus. Nasal exam shows no drainage or ulceration. Oropharynx is clear without any exudate.  NECK: Supple without any JVD. No thyromegaly.  CARDIOVASCULAR: Regular rate and rhythm. No murmurs, rubs, clicks, or gallops.  LUNGS: He has diminished breath sounds  bilaterally with wheezing, accessory muscle usage.  ABDOMEN: Soft, nontender, nondistended. Positive bowel sounds x4.  EXTREMITIES: Right lower extremity: He has a walking boot on the right foot. 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 Lower extremities: He has 1+ edema.  NEUROLOGIC: Cranial nerves II through XII grossly intact. No focal deficits.  PSYCHIATRIC: Not anxious or depressed.  LYMPH: Nodes nonpalpable   DIAGNOSTIC DATA: Glucose 104, BUN 14, creatinine 0.83, sodium 135, potassium 4.2, chloride 95, CO2 39, calcium 9.2. LFTs are normal. Troponin less than 0.02. WBC 7, hemoglobin 15.3, platelet count 161,000. INR 1.2.   Chest x-ray shows pulmonary vascular congestion, unchanged compared to prior exam, and cardiomegaly.   ASSESSMENT AND PLAN: The patient is a 64 year old white male with history of chronic systolic congestive heart failure, chronic respiratory failure, and chronic obstructive pulmonary disease presents with acute onset of respiratory failure.  1.  Acute on chronic respiratory failure due to combination of acute on chronic obstructive pulmonary disease flare, acute systolic congestive heart failure, possible acute bronchitis. At this time, we will treat him with IV Lasix b.i.d., continue BiPAP. We will treat him with IV Solu-Medrol, nebulizers and IV Levaquin. 2.  Acute on chronic systolic congestive heart failure. We will continue IV Lasix. We will ask his primary cardiologist to evaluate. We will also see if they recommend doing an echocardiogram of the heart.  3.  Diabetes. He will be on metformin and glimepiride and sliding scale insulin.  4.  Sleep apnea. The patient will be resumed on CPAP once he is off BiPAP. 5.  Smoking cessation, 4 minutes spent. Recommended to stop smoking. The patient is already on a nicotine patch.   Please note, the patient is a critical. He is at high risk of cardiopulmonary arrest. He will be monitored closely.  TIME SPENT: 55 minutes.   ____________________________ Lacie Scotts Allena Katz, MD shp:sb D: 10/21/2013 15:05:07 ET T: 10/21/2013 16:11:45 ET JOB#: [id number]  cc: Linzy Laury H. Allena Katz, MD, <Dictator> Charise Carwin MD ELECTRONICALLY SIGNED 10/25/2013 7:58

## 2014-05-27 NOTE — Consult Note (Signed)
PATIENT NAME:  Robert Mccarthy, Robert Mccarthy MR#:  [id number] DATE OF BIRTH:  [birth year]  DATE OF CONSULTATION:  01/31/2014  REFERRING PHYSICIAN:   CONSULTING PHYSICIAN:  Nada LibmanVance W. Damiean Lukes, MD  CONSULTING SERVICE: Intensive care unit.  REASON FOR CONSULTATION: Left arm swelling.   HISTORY: This is a 64 year old gentleman who was admitted on December 3 after being found slumping over a rail at home. He has had a protracted hospital course. He is now status post tracheostomy and PEG placement. He has been getting medications via a left arm PICC line. He was noted to have swelling in his left arm and an ultrasound was performed. This revealed a left upper extremity DVT along the PICC line in the brachial, axillary, and subclavian veins. The patient initially had to have his anticoagulation (Eliquis) discontinued because of bleeding from his tracheostomy tube. He is now back on his anticoagulation without signs of bleeding.   PAST MEDICAL HISTORY: Hypertension, COPD, congestive heart failure, type 2 diabetes, tobacco abuse, sleep apnea, and obesity.   PAST SURGICAL HISTORY: Arthroscopic knee surgery, inguinal hernia repair, and right foot debridement.   SOCIAL HISTORY: He smokes 1 pack a day x 45 years. No alcohol or drug abuse. He lives with his wife.   FAMILY HISTORY: Positive for heart disease and diabetes.  OUTPATIENT MEDICATIONS: Please see admission history and physical.   ALLERGIES: ANDROGEL.  REVIEW OF SYSTEMS: Unable to be obtained given the patient's intubation.   PHYSICAL EXAMINATION: VITAL SIGNS: Temperature is 98.1, respirations 24, heart rate 99, blood pressure 107/53.  GENERAL: The patient is resting comfortably. PULMONARY: Tracheostomy site is without bleeding.  ABDOMEN: Soft.  EXTREMITIES: Generalized anasarca. Left arm is swollen with petechial-type rash.  NEUROLOGIC: The patient does not follow commands.  HEENT: Normocephalic, atraumatic.   PERTINENT LABORATORY VALUES:  Creatinine is 0.81, white blood cell count is 15, hematocrit 39, hemoglobin 12.   DIAGNOSTIC STUDIES: Ultrasound from January 1 shows left upper extremity DVT along the PICC line in the brachial, axillary, and subclavian veins.   ASSESSMENT: Left arm deep vein thrombosis surrounding a peripherally inserted central catheter line in a critically ill patient.   PLAN: After discussing this with the PICC team, data from the CHEST guidelines recommend leaving the PICC line in place and treating the DVT with anticoagulation,  as removing it and placing it on the contralateral side carries a high risk for DVT as well. Currently, there is no evidence that there is septic thrombophlebitis associated with this, as his blood cultures have remained negative. He is now able to tolerate Eliquis without bleeding from his tracheostomy and PEG sites. Therefore, recommendation currently is to keep the PICC line in place as long as he can tolerate anticoagulation.     ____________________________ Nada LibmanVance W. Dwan Hemmelgarn, MD vwb:ST D: 01/31/2014 19:10:02 ET T: 01/31/2014 21:50:42 ET JOB#: [id number]  cc: Nada LibmanVance W. Dahlton Hinde, MD, <Dictator> Nada LibmanVANCE W Dianelly Ferran MD ELECTRONICALLY SIGNED 03/10/2014 23:08

## 2014-05-27 NOTE — Consult Note (Signed)
PATIENT NAME:  Robert Mccarthy, Robert Mccarthy MR#:  [id number] DATE OF BIRTH:  10/31/1950  DATE OF CONSULTATION:  01/02/2014  REFERRING PHYSICIAN:   CONSULTING PHYSICIAN:  Robert Adolph, MD  REFERRING PHYSICIAN:  Dr. Delfino Lovett.    REASON FOR CONSULTATION: Coffee-ground emesis.   HISTORY OF PRESENT ILLNESS: Robert Mccarthy is a 64 year old male with a past medical history notable for COPD, CHF, OSA, who presented to the hospital for evaluation of altered mental status and shortness of breath. He was found to have likely sepsis contributing and also hypoxia.   Here in the hospital he was also noted to have an episode of coffee-ground emesis. His hemoglobin has remained normal and stable. He is not a very good historian, he is quite drowsy at this time. However currently he denies to me any abdominal pain. He also reports that his last bowel movement was about 2-3 days ago. He cannot really give me any details about the coffee-ground emesis.   PAST MEDICAL HISTORY:  1. Hypertension.  2. COPD.   3. CHF.   4. DM 2.  5. OSA.  6. Obesity.   PAST SURGICAL HISTORY:  Arthroscopic knee surgery, also right foot debridement secondary to osteomyelitis, inguinal hernia repair.   HOME MEDICATIONS: Formoterol, aspirin, eplerenone, furosemide glimepiride, lisinopril, metoprolol, pramipexole, ProAir, Spiriva.   SOCIAL HISTORY: He is an ongoing smoker. No alcohol.   FAMILY HISTORY: No family history of GI malignancy.   REVIEW OF SYSTEMS: Cannot be obtained due to patient's drowsiness.   ALLERGIES:  ANDROGEL.     PHYSICAL EXAMINATION:  VITAL SIGNS: Temperature is 98.9, pulse is 94, respirations are 16, blood pressure 127/77, pulse oximetry is 90% on 3 liters.   GENERAL: Alert and oriented, but very drowsy, difficult time keeping his eyes open. HEENT: Normocephalic/atraumatic. Extraocular movements are intact. Anicteric. NECK: Soft, supple. JVP appears normal. No adenopathy. CHEST: Coarse breath sounds  bilaterally.   HEART: Regular. No murmur, rub, or gallop.  Normal S1 and S2. ABDOMEN: Soft, nontender, nondistended.  Normal active bowel sounds in all four quadrants.  No organomegaly. No masses EXTREMITIES: No swelling, well perfused. SKIN: No rash or lesion. Skin color, texture, turgor normal. NEUROLOGICAL: Grossly intact. PSYCHIATRIC: Normal tone and affect. MUSCULOSKELETAL: No joint swelling or erythema.   LABORATORY DATA: Sodium is 133, potassium 4.3, chloride 93, BUN 25, creatinine 1.12. Liver enzymes are normal except AST is mildly elevated at 57. Troponin is normal. White count 12, hemoglobin 16, hematocrit is 50, platelets are 108,000. INR is 1.1.  He has had a chest x-ray which shows vascular congestion and borderline cardiomegaly with increased interstitial markings raising concern for mild interstitial edema.   ASSESSMENT AND PLAN:  Coffee-ground emesis: His hemoglobin currently is normal and stable. He also has not had any bowel movements that he is reporting. It is difficult to ascertain the cause of the episode of coffee-ground emesis. However there certainly is no rush to perform upper endoscopy especially given his poor medical status currently and his multiple comorbidities.   RECOMMENDATIONS:  1.  Continue to monitor hemoglobin and hemodynamics.  2.  Will not plan to perform upper endoscopy unless he develops further episode of active bleeding such as melena or repeated episodes of coffee-ground emesis or a large drop in his hemoglobin.  3.  IV PPI q. 12 is reasonable.  4.  If no further episodes of coffee-ground emesis and hemoglobin remains stable will follow up in GI clinic for consideration of upper endoscopy depending on health status at  that time.   Thank you for this consult.      ____________________________ Robert AdolphMatthew Malyah Ohlrich, MD mr:bu D: 01/02/2014 17:40:43 ET T: 01/02/2014 20:26:47 ET JOB#: [id number]  cc: Robert AdolphMatthew Katrina Daddona, MD, <Dictator> Kathalene FramesMATTHEW G Claudell Wohler  MD ELECTRONICALLY SIGNED 02/02/2014 14:19

## 2014-05-27 NOTE — Consult Note (Signed)
Chief Complaint:  Subjective/Chief Complaint Patient stable. Critical condition. Urine clear. No issues with catheter per nursing staff.   VITAL SIGNS/ANCILLARY NOTES: **Vital Signs.:   02-Jan-16 00:00  Pulse Pulse 99  Respirations Respirations 24  Systolic BP Systolic BP 94  Diastolic BP (mmHg) Diastolic BP (mmHg) 49  Mean BP 64  Pulse Ox % Pulse Ox % 96  CO2 Monitor CO2 Monitor 46    01:00  Pulse Pulse 88  Respirations Respirations 24  Systolic BP Systolic BP 92  Diastolic BP (mmHg) Diastolic BP (mmHg) 50  Mean BP 64  Pulse Ox % Pulse Ox % 97  CO2 Monitor CO2 Monitor 41    02:00  Pulse Pulse 94  Respirations Respirations 24  Systolic BP Systolic BP 111  Diastolic BP (mmHg) Diastolic BP (mmHg) 58  Mean BP 75  Pulse Ox % Pulse Ox % 99  CO2 Monitor CO2 Monitor 42    03:00  Pulse Pulse 100  Respirations Respirations 25  Systolic BP Systolic BP 110  Diastolic BP (mmHg) Diastolic BP (mmHg) 60  Mean BP 76  Pulse Ox % Pulse Ox % 98  CO2 Monitor CO2 Monitor 25    03:06  Telemetry pattern Cardiac Rhythm Atrial fibrillation    04:00  Temperature Temperature (F) 100.2  Celsius 37.8  Temperature Source axillary  Pulse Pulse 98  Respirations Respirations 26  Systolic BP Systolic BP 118  Diastolic BP (mmHg) Diastolic BP (mmHg) 64  Mean BP 82  Pulse Ox % Pulse Ox % 97  CO2 Monitor CO2 Monitor 26    05:00  Pulse Pulse 97  Respirations Respirations 24  Systolic BP Systolic BP 94  Diastolic BP (mmHg) Diastolic BP (mmHg) 55  Mean BP 68  Pulse Ox % Pulse Ox % 94  CO2 Monitor CO2 Monitor 24    06:00  Pulse Pulse 95  Respirations Respirations 25  Systolic BP Systolic BP 103  Diastolic BP (mmHg) Diastolic BP (mmHg) 52  Mean BP 69  Pulse Ox % Pulse Ox % 95  CO2 Monitor CO2 Monitor 25    07:00  Vital Signs Type Routine  Temperature Temperature (F) 98.1  Celsius 36.7  Pulse Pulse 99  Respirations Respirations 24  Systolic BP Systolic BP 107  Diastolic BP (mmHg)  Diastolic BP (mmHg) 53  Mean BP 71  Pulse Ox % Pulse Ox % 98  Pulse Ox Activity Level  At rest  Oxygen Delivery Ventilator Assisted    08:00  Pulse Pulse 99  Respirations Respirations 26  Systolic BP Systolic BP 109  Diastolic BP (mmHg) Diastolic BP (mmHg) 59  Mean BP 75  Pulse Ox % Pulse Ox % 98    08:03  Telemetry pattern Cardiac Rhythm Atrial fibrillation; Atrial flutter    09:00  Pulse Pulse 102  Respirations Respirations 26  Systolic BP Systolic BP 113  Diastolic BP (mmHg) Diastolic BP (mmHg) 65  Mean BP 81  Pulse Ox % Pulse Ox % 94    09:03  Telemetry pattern Cardiac Rhythm Normal sinus rhythm  *Intake and Output.:   02-Jan-16 00:00  Grand Totals Intake:  68 Output:      Net:  68 24 Hr.:  1865  Enteral feeding ml     In:  42  Enteral Feeding flush intake      In:  25  IV (Primary)      In:  1  Tube Feeding Residual (ml) ml 60    01:00  Grand Totals Intake:  67 Output:      Net:  67 24 Hr.:  1932  Enteral feeding ml     In:  42  Enteral Feeding flush intake      In:  25  IV (Primary)      In:  0    02:00  Grand Totals Intake:  67 Output:      Net:  67 24 Hr.:  1999  Enteral feeding ml     In:  42  Enteral Feeding flush intake      In:  25    03:00  Grand Totals Intake:  67 Output:      Net:  67 24 Hr.:  2066  Enteral feeding ml     In:  42  Enteral Feeding flush intake      In:  25    04:00  Grand Totals Intake:  67 Output:      Net:  67 24 Hr.:  2133  Enteral feeding ml     In:  42  Enteral Feeding flush intake      In:  25  Tube Feeding Residual (ml) ml 120    05:00  Grand Totals Intake:  117 Output:      Net:  117 24 Hr.:  2250  Enteral feeding ml     In:  42  Enteral Feeding flush intake      In:  25  IV (Primary)      In:  50    05:28  Grand Totals Intake:   Output:  1100    Net:  -1100 24 Hr.:  1150  Weight Type daily  Weight Method Bed  Current Weight (lbs) (lbs) 329.3  Current Weight (kg) (kg) 149.4  Height (ft) (feet) 6   Height (in) (in) 0  Height (cm) centimeters 182.8  BSA (m2) 2.6  BMI (kg/m2) 44.7  Urine ml     Out:  1100  Urinary Method  Foley    06:00  Grand Totals Intake:  317 Output:      Net:  317 24 Hr.:  1467  Enteral feeding ml     In:  42  Enteral Feeding flush intake      In:  25  IV (Primary)      In:  250    07:00  Grand Totals Intake:  67 Output:      Net:  67 24 Hr.:  1534  Enteral feeding ml     In:  42  Enteral Feeding flush intake      In:  25    Shift 07:00  Grand Totals Intake:  837 Output:  1100    Net:  -263 24 Hr.:  1534  Enteral feeding ml     In:  336  Enteral Feeding flush intake      In:  200  IV (Primary)      In:  300  IV (Primary)      In:  1  Urine ml     Out:  1100  Length of Stay Totals Intake:  93584.3 Output:  [id number]    Net:  6419.3    Daily 07:00  Grand Totals Intake:  3254 Output:  1720    Net:  1534 24 Hr.:  1534  Enteral feeding ml     In:  1008  Enteral Feeding flush intake      In:  600  IV (Primary)      In:  1300  IV (  Primary)      In:  46  IV (Secondary)      In:  300  Urine ml     Out:  1675  Other Output ml     Out:  45  Length of Stay Totals Intake:  93584.3 Output:  13086    Net:  6419.3    07:57  Tube Feeding Residual (ml) ml 10    08:00  Grand Totals Intake:  67 Output:      Net:  67 24 Hr.:  67  Enteral feeding ml     In:  42  Enteral Feeding flush intake      In:  25    08:32  Grand Totals Intake:  255 Output:      Net:  255 24 Hr.:  322  IV (Secondary)      In:  255    09:00  Grand Totals Intake:  67 Output:      Net:  67 24 Hr.:  389  Enteral feeding ml     In:  42  Enteral Feeding flush intake      In:  25    09:22  Grand Totals Intake:  300 Output:      Net:  300 24 Hr.:  689  IV (Secondary)      In:  300    10:00  Grand Totals Intake:  69 Output:      Net:  69 24 Hr.:  758  Enteral feeding ml     In:  42  Enteral Feeding flush intake      In:  25  IV (Primary)      In:  2    Shift  15:00  Grand Totals Intake:  758 Output:      Net:  758 24 Hr.:  758  Enteral feeding ml     In:  126  Enteral Feeding flush intake      In:  75  IV (Primary)      In:  2  IV (Secondary)      In:  300  IV (Secondary)      In:  255  Length of Stay Totals Intake:  94342.3 Output:  57846    Net:  7177.3   Brief Assessment:  Additional Physical Exam Critical condition Tracheostomy present PEG tube present 24 Fr rouche (2-way) catheter draining entirely clear urine   Assessment/Plan:  Assessment/Plan:  Assessment 64 YO gentleman with multiple medical issues with recent onset gross hematuria likely 2/2 to anticoagulation and presence and need for foley. Exchanged to larger calibre catheter and hematuria has since resolved. Urine entirely clear. Catheter flushes easily. No concerns from nursing staff.   Plan Urology will follow peripherally as need but no further recommendations for now. Foley to be changed once monthly so long as patient requires a foley. He should f/u with Dr. Vanna Scotland of [facility] for ongoing urologic care. If he requires foley for extended time (more than several months), consideration should be made for SP tube drainage and discussed with patient and family.  Roselyn Bering, MD Urologic Surgery   Electronic Signatures: Angelina Pih (MD)  (Signed 02-Jan-16 10:25)  Authored: Chief Complaint, VITAL SIGNS/ANCILLARY NOTES, Brief Assessment, Assessment/Plan   Last Updated: 02-Jan-16 10:25 by Angelina Pih (MD)

## 2014-05-27 NOTE — Consult Note (Signed)
PATIENT NAME:  Robert Mccarthy, Robert Mccarthy DATE OF BIRTH:  March 27, 1950  DATE OF CONSULTATION:  01/15/2014  REFERRING PHYSICIAN:  Dory LarsenKurian D. Kasa, MD  CONSULTING PHYSICIAN:  Kyung Ruddreighton C. Shariff Lasky, MD  REASON FOR CONSULTATION: Respiratory failure.    HISTORY OF PRESENT ILLNESS: The patient is a 64 year old gentleman with severe COPD admitted to the intensive care unit and intubated with failure to wean from vent and I was consulted for need for long-term ventilatory support. Of note, the patient earlier today was diagnosed with submassive acute bilateral pulmonary emboli with a right heart strain and started on heparin which I believe was diagnosed after consultation was initially placed.   PAST MEDICAL HISTORY: Significant for chronic respiratory failure, cellulitis, hypertension, COPD, congestive heart failure, diabetes type 2, tobacco abuse obstructive sleep apnea, obesity.   PAST SURGICAL HISTORY: Arthroscopic knee, right foot surgery, and inguinal hernia repair.   SOCIAL HISTORY:  The patient smokes 1 pack a day for 45 years. Denies any significant drinking or alcohol. Lives at home with his wife.   FAMILY HISTORY: Heart disease and diabetes.   CURRENT MEDICATIONS: Fentanyl, propofol, insulin drip, heparin drip, norepinephrine drip, Tylenol, aspirin, lisinopril, metoprolol, nicotine, nystatin, Mirapex, Zofran, chlorhexidine, Pepcid, lorazepam, Colace, Lasix, lactulose, Levaquin, Senokot.   ALLERGIES:  ANDROGEL.    PHYSICAL EXAMINATION:  VITAL SIGNS: Temperature is 99.4, pulse is 76, respirations 30, blood pressure 74/47, pulse oximetry is 96% with intubation.  GENERAL: The patient is sedated and intubated.  EARS: External EACs are clear.  NOSE: Clear anteriorly.  ORAL CAVITY AND OROPHARYNX: Orotracheal tube in place and secure.  NECK: Palpable thyroid landmarks.   IMPRESSION: Respiratory failure with need for long-term ventilatory support.   PLAN: Unfortunately the recent  diagnosis of acute submassive  bilateral pulmonary emboli complicates the picture as the patient, similar to his PEG tube placement, will need to be off heparin for approximately 12 hours prior and at least 12 hours after and I have tentatively placed him on the schedule for the next Thursday, a week from today, if that will be a time when he is able to come off or if it is going to be longer than that we will need to have a reconsultation for when trach placement needs to occur.     ____________________________ Kyung Ruddreighton C. Lanesha Azzaro, MD ccv:AT D: 01/15/2014 18:07:15 ET T: 01/15/2014 22:28:41 ET JOB#: [id number]  cc: Kyung Ruddreighton C. Daxter Paule, MD, <Dictator> Kyung RuddREIGHTON C Litsy Epting MD ELECTRONICALLY SIGNED 02/11/2014 13:24

## 2014-05-27 NOTE — Op Note (Signed)
PATIENT NAME:  Robert Mccarthy, [name] MR#:  [id number] DATE OF BIRTH:  09-Jun-1950  DATE OF PROCEDURE:  01/27/2014  PREOPERATIVE DIAGNOSES:  Respiratory failure.   POSTOPERATIVE DIAGNOSIS: Respiratory failure.   PROCEDURE PERFORMED: Tracheostomy tube, elective.  ANESTHESIA: General endotracheal anesthesia.   ESTIMATED BLOOD LOSS: Less than 10 mL.   INTRAVENOUS FLUIDS: Please see anesthesia record.   COMPLICATIONS: None.   DRAINS AND STENT PLACEMENTS: Shiley XLT size 8 proximal extension tracheostomy tube.   SPECIMENS: None.   INDICATIONS FOR PROCEDURE: The patient is a 64 year old male with history of respiratory failure with need for long-term ventilatory support, presenting for tracheostomy tube placement.   OPERATIVE FINDINGS: Very short neck and obese patient, need for extensive removal of fat anteriorly in neck. Due to width of the patient's neck, he required an extended length tracheostomy tube.   DESCRIPTION OF PROCEDURE: After the patient was identified in the intensive care unit, the patient was taken to the operating room and consent was reviewed. The patient was moved from his unit bed to the OR table in the supine position. General endotracheal anesthesia was continued. A shoulder roll was placed, and the patient was prepped and draped in a sterile fashion after injection of 8 mL into the patient's anterior neck. A marker was used to mark an anterior neck crease, and a 15 blade scalpel was used to make a horizontal incision approximately 2 fingerbreadths above the sternal notch, overlying his cricoid. Dissection was carried through the platysma. There is a significant amount of fat in between the subcutaneous tissue and platysma. This needed to be debulked for proper placement of the tracheostomy tube. The strap muscles were identified. The median raphe was divided vertically, and the cricoid cartilage was identified. A plane between the thyroid isthmus and the cricoid cartilage was  entered, and this was dissected using a Kelly clamp and then using a Harmonic scalpel, the thyroid isthmus was divided along its entirety. This was then grasped with a Babcock and this was retracted laterally on both sides. The anterior tracheal wall was encountered. This was cleaned with the Bovie electrocautery and a peanut. A cricoid hook was placed in the patient's cricoid. This was retracted superiorly with the balloon down and then the balloon was reinflated by anesthesia. At this time, an 11 blade scalpel was used to make a horizontal incision between tracheal rings 2 and 3, and the endotracheal tube was withdrawn under direct visualization, until just right above the tracheostomy site. At this time, a Shiley size 8 proximal extension tracheostomy tube was placed through the tracheostomy site with good ventilation through the ventilator. At this time, the tracheostomy tube was secured in a 4-point fashion using silk sutures, and Surgicel was placed in the wound bed and superiorly and inferiorly to the tracheostomy site. A Betadine gauze was placed underneath the tracheostomy site and then this was secured with a Dale collar. At this time, care of the patient was transferred to anesthesia, where the patient was taken back to the unit in serious condition.    ____________________________ Kyung Ruddreighton C. Starlit Raburn, MD ccv:mw D: 01/28/2014 07:22:23 ET T: 01/28/2014 11:11:33 ET JOB#: [id number]  cc: Kyung Ruddreighton C. Khalis Hittle, MD, <Dictator> Kyung RuddREIGHTON C Enma Maeda MD ELECTRONICALLY SIGNED 02/11/2014 13:24

## 2014-05-31 NOTE — Op Note (Signed)
PATIENT NAME:  Robert Mccarthy, Robert Mccarthy MR#:  [id number] DATE OF BIRTH:  1950/06/17  DATE OF PROCEDURE:  02/04/2014  ATTENDING PHYSICIAN:  Linus Salmonshapman Anallely Rosell, MD.    OPERATION PERFORMED: Emergent control of postoperative tracheostomy bleeding.   OPERATIVE FINDINGS: Diffuse oozing from around the tracheostomy site, particularly from the left thyroid bed.   DESCRIPTION OF PROCEDURE: [name] was brought emergently from the intensive care unit. He had undergone a tracheostomy by Dr. Andee PolesVaught approximately 1 week prior.  He had diffuse oozing.  He was seen by Dr. Willeen CassBennett, was noticed to have increased bleeding thought to be of vascular nature. He was transferred from his ICU bed onto the OR bed. He had a flexible Bivona tracheostomy  in position. General anesthesia was administered. The neck was gently extended.  He was draped in the usual fashion for neck surgery. The FiO2 was turned down as low as was found safe. Using 2 suctions including suction Bovie there were several bleeding points that were identified in the tracheostomy site bed, particularly on the left area of the thyroid. These were cauterized using the suction cautery.  I then went circumferentially around the tracheostomy all the way down to the trachea and cauterized any granulation tissue, any other active bleeding sites. After cauterizing everything the wound was copiously irrigated with saline. Reinspection again showed no evidence of active bleeding. Vaginal packing was then used to circumferentially pack around the tracheostomy with significant pressure in the bed where the tracheostomy was entering the trachea. This was then brought out of the wound inferiorly and soaked with Betadine solution. The patient was then thoroughly cleaned. The oral cavity was suctioned free.  He was then returned to anesthesia where he will be returned to the ICU.    CULTURES: None.   SPECIMENS: None.   ESTIMATED BLOOD LOSS: Approximately 50 mL.     ____________________________ Davina Pokehapman T. Notnamed Croucher, MD ctm:bu D: 02/04/2014 13:05:10 ET T: 02/04/2014 19:57:21 ET JOB#: [id number]  cc: Davina Pokehapman T. Latham Kinzler, MD, <Dictator> Davina PokeHAPMAN T Searcy Miyoshi MD ELECTRONICALLY SIGNED 02/13/2014 8:19

## 2014-05-31 NOTE — Discharge Summary (Signed)
PATIENT NAME:  Robert Mccarthy, Robert Mccarthy MR#:  [id number] DATE OF BIRTH:  04-12-1950  DATE OF ADMISSION:  01/01/2014 DATE OF DISCHARGE:  02/04/2014  DISPOSITION: The patient is being transferred to [facility].  DISCHARGE DIAGNOSES: 1.  Acute on chronic respiratory failure, vent dependent.  2.  Sepsis.  3.  Acute bilateral pulmonary embolism with left upper extremity deep vein thrombosis.  4.  Chronic obstructive pulmonary disease exacerbation.  5.  Methicillin-resistant Staphylococcus aureus, Citrobacter and Stenotrophomonas pneumonia.  6.  Disseminated varicella zoster.  7.  Coagulase-negative staphylococcus bacteremia.  8.  Adrenal insufficiency.  9.  Acute on chronic diastolic congestive heart failure.  10.  Hyponatremia.  11.  Type 2 diabetes mellitus.  12.  Morbid obesity.  13.  Sacral decubitus ulcers and heel ulcers.  14.  Hyperkalemia.  15.  Hematuria.  16.  Severe tracheal bleed status post cauterization in the operating room on 02/04/2014. 17.  Anemia of chronic disease and acute blood loss anemia.  18.  Tobacco abuse.  19.  Hypertension.  20.  Arthritis.   Please refer to previously dictated interim discharge summary and admitting history and physical.   CONSULTANTS: 1.  ENT with Dr. Bud Facereighton Vaught.  2.  GI with Dr. Bluford Kaufmannh.  3.  Urology with Dr. Karie Sodaajesh Kurpad. 4.  Pulmonary with Dr. Belia HemanKasa.  5.  Nephrology with Dr. Wynelle LinkKolluru. 6.  Infection disease with Dr. Sampson GoonFitzgerald.  7.  Palliative care with Dr. Harvie JuniorPhifer.  8.  Surgery with Dr. Egbert GaribaldiBird.   IMAGING STUDIES:  1.  CTA of the chest showed bilateral submassive pulmonary embolism.  2.  Left upper extremity Doppler showed DVT along the PICC line.  3.  CT scan of the abdomen and pelvis without contrast showed pneumonia involving right lower lobe, hepatic cirrhosis, nonobstructive 2 mm calculus of the right kidney.  4.  Lower extremity Dopplers showed nonocclusive DVT in the right deep femoral vein.  5.  Chest x-ray showed bilateral  pneumonia.   ADMITTING HISTORY AND PHYSICAL: Please see detailed H and P dictated previously. In brief, a 64 year old Caucasian male patient who presented to the Emergency Room on 01/01/2014 with shortness of breath, confusion.   HOSPITAL COURSE: 1.  Acute on chronic respiratory failure, vent dependent. The patient was treated for COPD exacerbation, acute on chronic diastolic CHF, and also MRSA, Citrobacter and Stenotrophomonas pneumonia. The patient has been on multiple antibiotics. His most recent cultures have shown MRSA, Citrobacter and Stenotrophomonas. Presently, the plan is to continue the patient on levofloxacin, ceftriaxone and linezolid until 02/10/2014. Wean off ventilator as tolerated.  2.  Disseminated varicella zoster. The patient has had vesicular rash, which was sent for varicella zoster studies. This has returned positive, and the patient has been started on Valtrex, on 02/04/2014, per ID recommendations. The patient will need infectious disease follow-up at [facility]. IgG and IgM for varicella zoster from the serum have been sent and are pending.  3.  Tracheostomy bleeding. The patient had bleeding from his tracheostomy site 2 days after his tracheostomy was placed, which resolved with some packing, but on 02/04/2014 the patient had significant bleeding, which could not be resolved with packing. He had to be taken to the OR, had contrast, had significant amount of bleeding and per ENT recommendations his anticoagulation has been stopped.  4.  Acute left upper extremity DVT and common femoral vein DVT and bilateral submassive PEs. The patient was initially on a heparin drip, later switched to Eliquis, but the patient had severe  tracheostomy area of bleeding. At this time, I have discussed the case with Dr. Belia Heman and ENT. Anticoagulation is being stopped for the next 1 week. Depending on the patient's clinical course and if he has no further bleeding he can be resumed on anticoagulation.   5.  Sacral decubitus ulcer. The patient is getting wound care and daily dressing. If there is any worsening he will need surgical consultation.   Today, the patient continues to be on full vent support through his trach. No further tracheostomy site bleeding. He continues to get PEG feeds, IV antibiotics, and for continuity of further care, the patient is being transferred to [facility].   Please call back with any questions.   CODE STATUS: FULL code.   ____________________________ Molinda Bailiff Yosiel Thieme, MD srs:sb D: February 10, 2014 10:02:12 ET T: Feb 10, 2014 11:04:53 ET JOB#: [id number]  cc: Wardell Heath Mccarthy. Adrick Kestler, MD, <Dictator> Orie Fisherman MD ELECTRONICALLY SIGNED 02/13/2014 12:51

## 2014-05-31 NOTE — Consult Note (Signed)
Allergies:  AndroGel 2.5 g/packet: SOB, Swelling  Assessment/Plan:  Assessment/Plan I was contacted yesterday at 5:30 PM regarding the Endocrine consult request. Attempted to eval patient today, however he was not in his room. RN indicated he was in the OR. I will attempt to see him again this afternoon, likely after 4 PM.   Electronic Signatures: Cassey Hurrell, AnRaj Janusna M (MD)  (Signed 06-Jan-16 12:51)  Authored: ALLERGIES, Assessment/Plan   Last Updated: 06-Jan-16 12:51 by Raj JanusSolum, Rexton Greulich M (MD)

## 2014-05-31 NOTE — Consult Note (Signed)
PATIENT NAME:  Robert Mccarthy, Ziggy R MR#:  [id number] DATE OF BIRTH:  March 23, 1950  DATE OF CONSULTATION:  02/04/2014  REFERRING PHYSICIAN:  Laverda SorensonSarath C. Kolluru, MD CONSULTING PHYSICIAN:  A. Wendall MolaMelissa Meiko Ives, MD  CHIEF COMPLAINT: Concern for adrenal insufficiency.   HISTORY OF PRESENT ILLNESS: This is a 64 year old male with a medical history of coronary disease, congestive heart failure, COPD, morbid obesity, and diabetes who was admitted on 01/01/2014 for left lower extremity cellulitis. 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 All records were reviewed. The patient is not able to be interviewed due to his tracheostomy and mechanical ventilation. Family at bedside and nursing did assist with history. Due to COPD exacerbation, the patient was placed on high-dose steroids starting on December 8 and lasting until December 16. He was initially given 40 mg methylprednisolone q.8 hours and this was changed to q.24 hour dosing from December 11 to December 16.  The patient had mild hyponatremia in late December with sodium in the 131 range. This decreased on December 27th to 132 and then down to 127 by December 30th, in conjunction with hyperkalemia. Due to concern for adrenal insufficiency, Dr. Wynelle LinkKolluru obtained a cortisol level. Records indicate that on December 30th at  3:49 p.m., cortisol level was 2.1. He was started on hydrocortisone 10 mg q.12 on December 31st as well as fludrocortisone 0.1 mg daily. Subsequently his sodium levels normalized and have been in the 138 to 143 range. He was given Kayexalate for the hyperkalemia, which has also resolved. Prior to starting hydrocortisone and fludrocortisone, blood pressure was in the 100 to 130 systolic range;   however since these medications were started, it has then increased into the 150 to 160 range. He had been on blood thinners including Eliquis. Unfortunately, the patient had a significant bleed associated with his tracheostomy earlier today which required packing in the  operating room. He has not had any known abdominal trauma. No known prior history of adrenal insufficiency before this hospitalization. I am not able to assess whether he has used chronic steroids prior to this hospitalization.   PAST MEDICAL HISTORY: 1. Morbid obesity.  2. COPD. 3. ASCVD.  4. Congestive heart failure.  5. Hypertension.  6. Type 2 diabetes.  7. Tobacco dependence.  8. Obstructive sleep apnea.  9. Decubitus ulceration.  10. Vent-associated pneumonia.  11. Cirrhosis (on CT imaging).   CURRENT MEDICATIONS: 1. Ceftriaxone 1 gram q.12 hours.  2. Colace 100 mg q.12 hours.  3. Pepcid 20 mg b.i.d.  4. Fludrocortisone 0.1 mg daily.  5. Hydrocortisone 10 mg q.12 hours.  6. Levaquin 750 mg q.24h.  7. Zyvox 600 mg q.12h.  8. Magnesium oxide 400 mg daily.  9. Senokot 8.8 mg q.12 hours.  10. Lantus 15 units subcutaneous daily.  11. NovoLog insulin sliding scale.  12. Albuterol - ipratropium nebulizer q.4 hours while awake.  13. Pulmicort 0.5 mg nebulizer q.12 hours.   ALLERGIES:  REPORTED ALLERGY TO ANDROGEL WHICH CAUSED SHORTNESS OF BREATH AND SWELLING.   SOCIAL HISTORY: History of tobacco use; was smoking 1 pack of cigarettes per day x45 years prior to hospitalization. Denied alcohol and illicit drug on admission. Married.   FAMILY HISTORY: Positive for heart disease and diabetes.   REVIEW OF SYSTEMS: Unable to obtain.   PHYSICAL EXAMINATION: VITAL SIGNS: Height  71.9 inches, weight 316 pounds, BMI 43. Temperature 99.7, pulse 83, respirations 23, blood pressure 160/85, pulse oximetry 98% on room air.  GENERAL: A morbidly obese white male, alert.  HEENT: Oropharynx is clear. Mucous membranes moist.   NECK: The patient has tracheostomy in place.  PULMONARY: Clear to auscultation anteriorly bilaterally without wheeze.  CARDIAC: Regular rate and rhythm. No appreciable JVD. 2+ palpable DP pulses on the left, unable to palpate pulses on the right.  ABDOMEN: Diffusely soft,  no elicited tenderness.  EXTREMITIES: Mild nonpitting edema. Diffuse weakness present.  NEUROLOGIC: Difficult to assess.  SKIN: The patient has petechia on the lower abdomen. He has a dark right lower extremity from below the knee.    LABORATORY DATA: Glucose 168, BUN 47, creatinine 0.59, sodium 143, potassium 4.1 calcium 10.4, magnesium 1.6. Those labs are on 02/03/2014. On 02/02/2014, WBC 10.4, hematocrit 37%, platelets 232,000.   ASSESSMENT: 1. Prior hyponatremia, now resolved.  2. Prior hyperkalemia, now resolved.  3. Concern for adrenal insufficiency.  4. Use of blood thinners.  5. Prior use of high-dose steroids.   PLAN:  I suspect the patient may have had adrenal insufficiency prompting his hyponatremia and hyperkalemia, although very difficult to say at this time. The single cortisol that was checked appears to have been drawn in the midafternoon. Midafternoon cortisol was not a reliable time to check a cortisol. Most reliable time to assess cortisol for adrenal insufficiency is in the early morning cortisol. Cause of potential adrenal insufficiency may have been use of preceding high-dose steroids for about 10 days which were not tapered. His hyponatremia and hyperkalemia have improved since he has been on steroids. However, now he has developed hypertension. Would consider reducing dose of the fludrocortisone; however, I am concerned as the patient is going to leave the hospital tomorrow to go to a long-term care facility, and therefore we cannot promptly reassess effect on his electrolytes and pressures. I think the benefit to reducing his fludrocortisone and improving blood pressure warrants doing this now. Regarding  his glucocorticoids, would reassess in 3 to 4 days and consider reducing his afternoon dose of hydrocortisone to 5 mg. Then continue to reassess every 3 to 4 days and if electrolytes remain stable, then reduce hydrocortisone further to 5 mg b.i.d. and slowly taper off thereafter, as tolerated. Once off the hydrocortisone, could reassess with a ACTH (cosyntropin) stimulation test for persistent adrenal insufficiency. No role in obtaining that test now as he has been on hydrocortisone for several days and would be expected to have somewhat of a suppressed HPA axis.   Thank you for the kind request for consultation. If he does remain hospitalized at this facility, I will follow along with you.    ____________________________ A. Wendall Mola, MD ams:dw D: 02/04/2014 17:36:11 ET T: 02/04/2014 20:25:50 ET JOB#: [id number]  cc: A. Wendall Mola, MD, <Dictator> Macy Mis MD ELECTRONICALLY SIGNED 02/12/2014 21:42

## 2015-08-02 IMAGING — CT CT NECK W/ CM
4 of 6 series · 15 of 33 positions shown, 17 images · IV contrast (Omni 300)
Comparison: CT 01/31/2014

CLINICAL DATA: Drainage around tracheostomy 2. Concern for abscess.

EXAM:
CT NECK WITH CONTRAST
TECHNIQUE: Multidetector CT imaging of the neck was performed using the
standard protocol following the bolus administration of intravenous
contrast.
CONTRAST:  75 mL I Omnipaque sovue

[Series 2: neck 2.0 i31s 3 · axial · 0.60mm/px · z∈[+1224,+1362]mm · 4 of 117 slices shown, 5 images]
[im 24/117  soft-tissue]
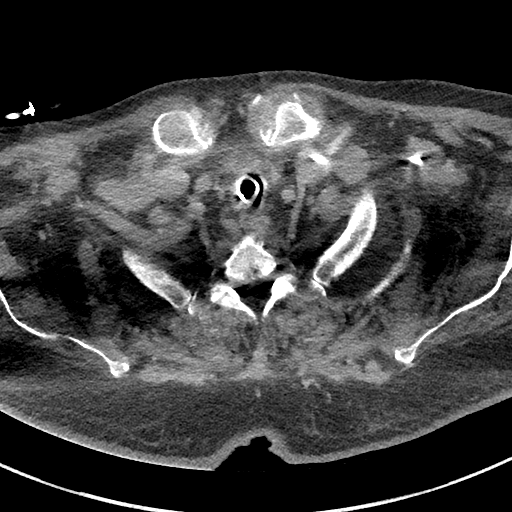
[im 24/117  bone]
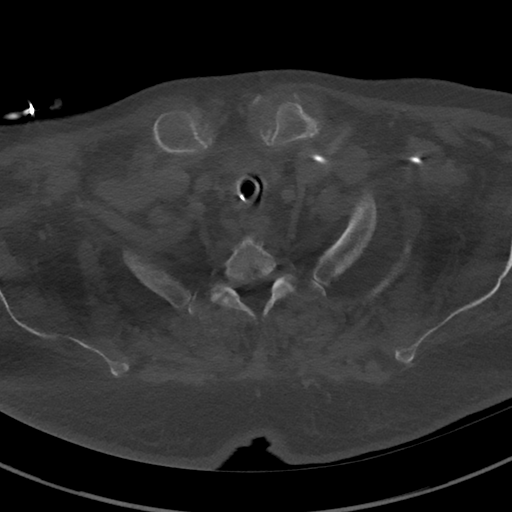
[im 47/117  bone]
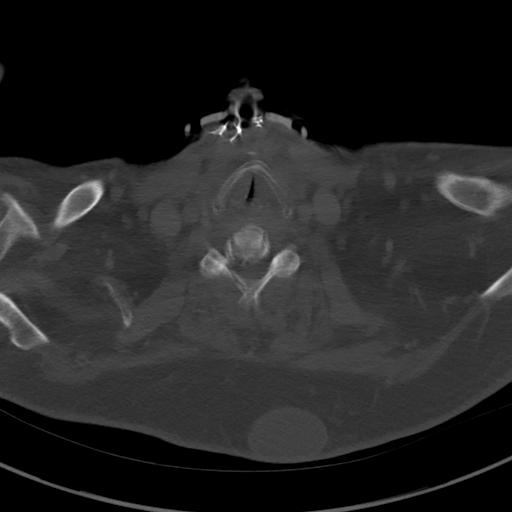
[im 70/117  bone]
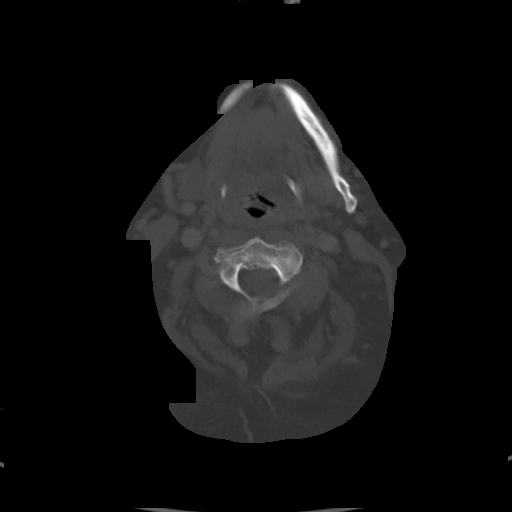
[im 93/117  bone]
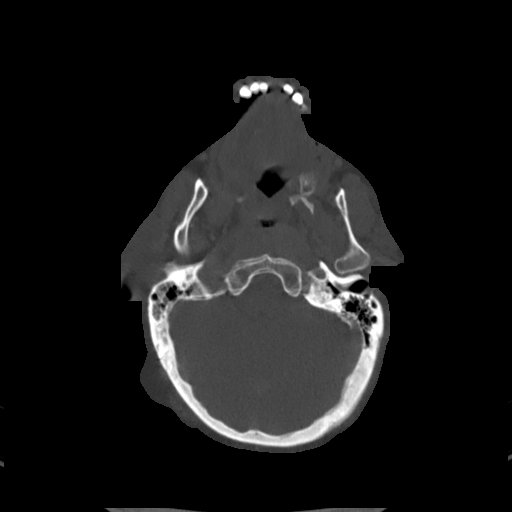

[Series 6: coronal st · coronal · 0.55mm/px · 3 of 120 slices shown]
[im 24/120  bone]
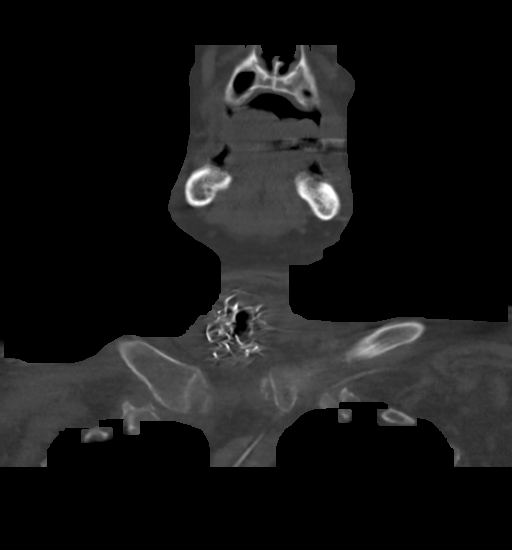
[im 48/120  bone]
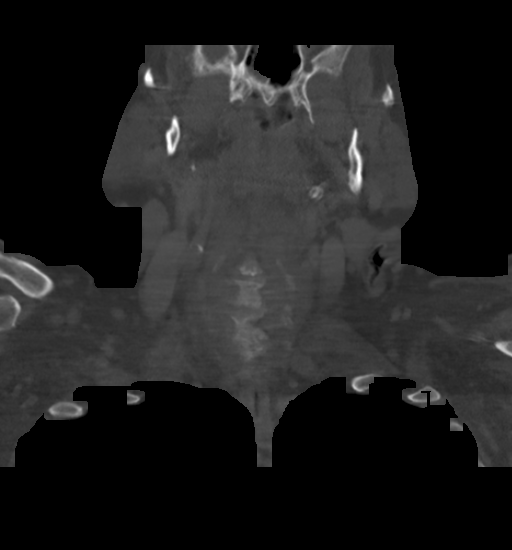
[im 72/120  bone]
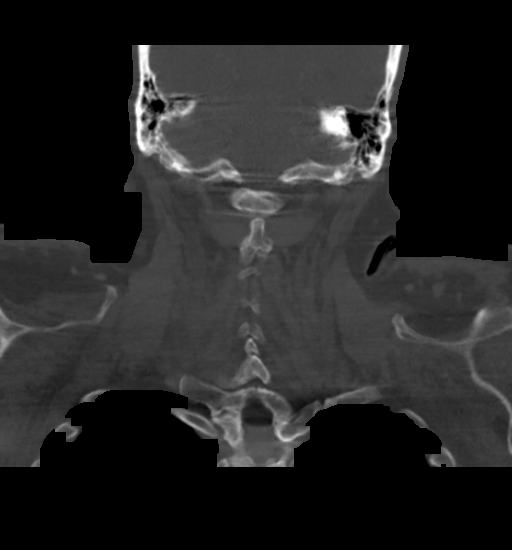

[Series 7: sagittal st · sagittal · 0.54mm/px · 5 of 125 slices shown, 6 images]
[im 42/125  bone]
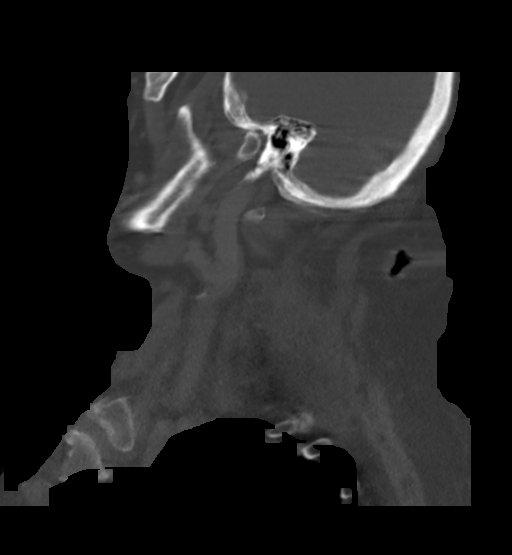
[im 52/125  bone]
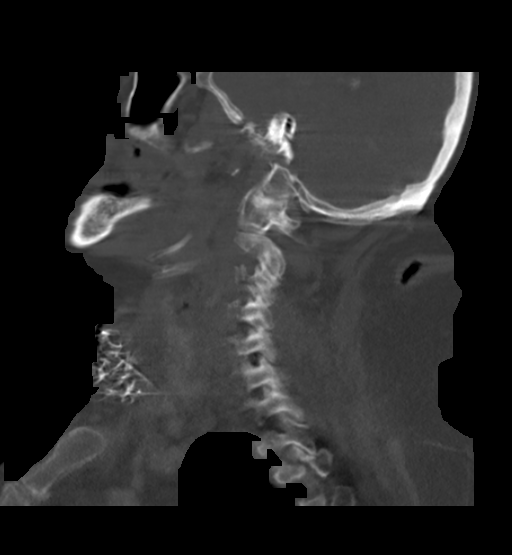
[im 63/125  soft-tissue]
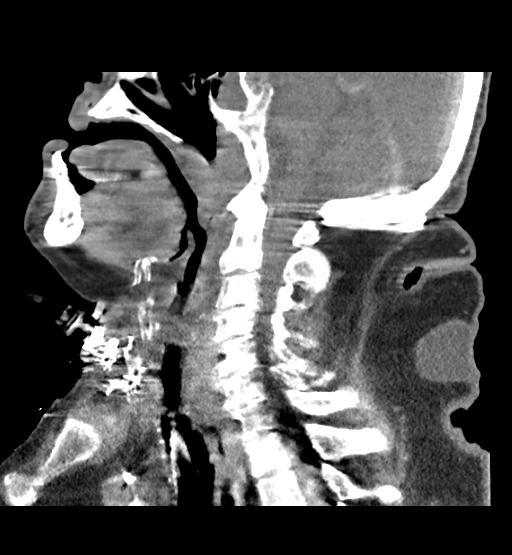
[im 63/125  bone]
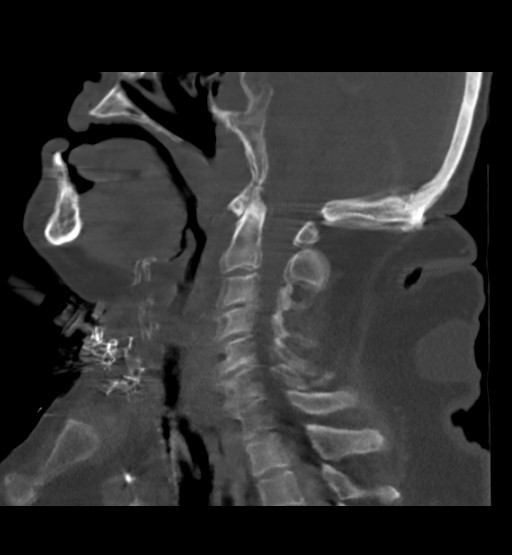
[im 73/125  bone]
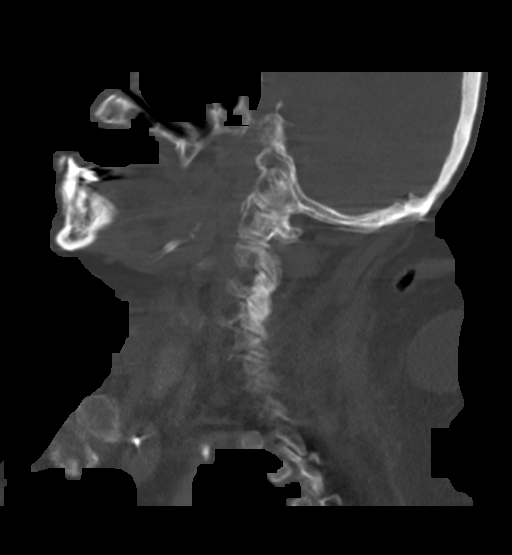
[im 83/125  bone]
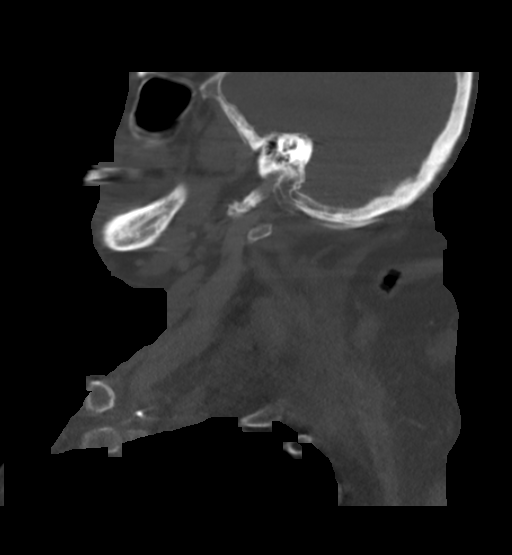

[Series 8: orthogonal st · axial · 0.70mm/px · z∈[+1259,+1366]mm · 3 of 108 slices shown]
[im 27/108  bone]
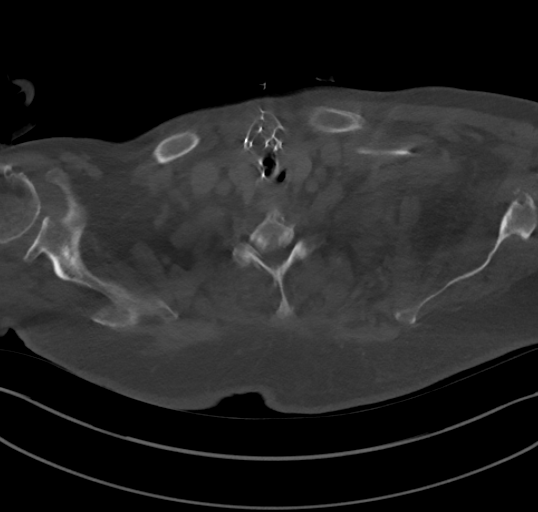
[im 54/108  bone]
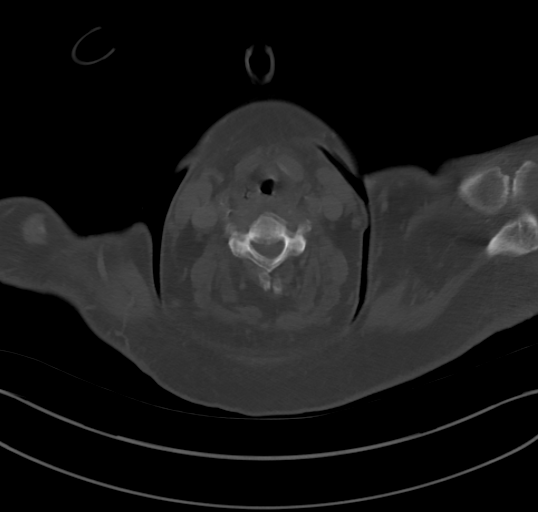
[im 81/108  bone]
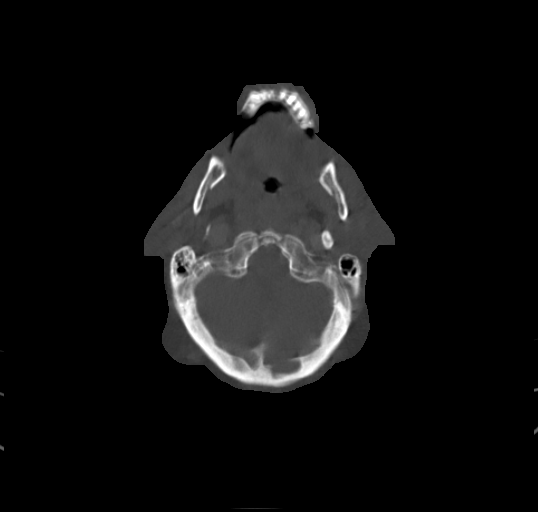

[15 of 33 positions shown; findings below may reference images not displayed]

FINDINGS: Motion degradation of  exam.

Pharynx and larynx: Tracheostomy enters the trachea just below the
glottis. Tube is patent and in good position. There is no fluid
collection surrounding the tracheostomy. There is mild thickening of
the the vallecula and piriform sinuses. There is mild thickening of
the palate tonsils.

Salivary glands: Several glands are normal.

Thyroid: Normal

Lymph nodes: No lymphadenopathy.

Vascular: No vascular abnormality.

Limited intracranial: Limited view of the inferior brain is normal.

Mastoids and visualized paranasal sinuses: Mucosal thickening in the
sphenoid sinus. Mastoid air cells are clear

Skeleton: Aggressive osseous lesion.

Upper chest: Mild intra additional thickening
IMPRESSION: 1. Tracheostomy tube in good position without evidence of abscess.
2. Thickening of the parapharyngeal mucosa and supraglottic tissues
3. Mild interstitial edema of the lung apices.
